# Patient Record
Sex: Female | Born: 1935 | Race: White | Hispanic: No | Smoking: Former smoker
Health system: Southern US, Community
[De-identification: ages and names within clinical notes are randomized; demographics above are authoritative.]

## PROBLEM LIST (undated history)

## (undated) DIAGNOSIS — Z8489 Family history of other specified conditions: Secondary | ICD-10-CM

## (undated) DIAGNOSIS — N289 Disorder of kidney and ureter, unspecified: Secondary | ICD-10-CM

## (undated) DIAGNOSIS — H353290 Exudative age-related macular degeneration, unspecified eye, stage unspecified: Secondary | ICD-10-CM

## (undated) DIAGNOSIS — E119 Type 2 diabetes mellitus without complications: Secondary | ICD-10-CM

## (undated) DIAGNOSIS — G459 Transient cerebral ischemic attack, unspecified: Secondary | ICD-10-CM

## (undated) DIAGNOSIS — G629 Polyneuropathy, unspecified: Secondary | ICD-10-CM

## (undated) DIAGNOSIS — H409 Unspecified glaucoma: Secondary | ICD-10-CM

## (undated) DIAGNOSIS — M810 Age-related osteoporosis without current pathological fracture: Secondary | ICD-10-CM

## (undated) DIAGNOSIS — E785 Hyperlipidemia, unspecified: Secondary | ICD-10-CM

## (undated) DIAGNOSIS — Z87891 Personal history of nicotine dependence: Secondary | ICD-10-CM

## (undated) DIAGNOSIS — H9190 Unspecified hearing loss, unspecified ear: Secondary | ICD-10-CM

## (undated) DIAGNOSIS — N63 Unspecified lump in unspecified breast: Secondary | ICD-10-CM

## (undated) DIAGNOSIS — Z8619 Personal history of other infectious and parasitic diseases: Secondary | ICD-10-CM

## (undated) DIAGNOSIS — I2510 Atherosclerotic heart disease of native coronary artery without angina pectoris: Secondary | ICD-10-CM

## (undated) DIAGNOSIS — I10 Essential (primary) hypertension: Secondary | ICD-10-CM

## (undated) DIAGNOSIS — M1990 Unspecified osteoarthritis, unspecified site: Secondary | ICD-10-CM

## (undated) HISTORY — PX: TONSILLECTOMY: SUR1361

## (undated) HISTORY — PX: BREAST BIOPSY: SHX20

## (undated) HISTORY — PX: APPENDECTOMY: SHX54

## (undated) HISTORY — PX: CARPAL TUNNEL RELEASE: SHX101

---

## 1960-12-16 HISTORY — PX: INNER EAR SURGERY: SHX679

## 1961-12-16 HISTORY — PX: TUBAL LIGATION: SHX77

## 1979-08-17 HISTORY — PX: MASTECTOMY: SHX3

## 1998-12-16 HISTORY — PX: CATARACT EXTRACTION W/ INTRAOCULAR LENS  IMPLANT, BILATERAL: SHX1307

## 2002-12-16 HISTORY — PX: CORONARY ANGIOPLASTY WITH STENT PLACEMENT: SHX49

## 2012-12-07 ENCOUNTER — Encounter (INDEPENDENT_AMBULATORY_CARE_PROVIDER_SITE_OTHER): Payer: Self-pay | Admitting: Ophthalmology

## 2013-05-10 ENCOUNTER — Encounter (HOSPITAL_COMMUNITY): Payer: Self-pay | Admitting: Emergency Medicine

## 2013-05-10 ENCOUNTER — Inpatient Hospital Stay (HOSPITAL_COMMUNITY)
Admission: EM | Admit: 2013-05-10 | Discharge: 2013-05-11 | DRG: 313 | Disposition: A | Discharge status: Nursing Home (Includes ICF) | Payer: Medicare PPO | Attending: Internal Medicine | Admitting: Internal Medicine

## 2013-05-10 ENCOUNTER — Emergency Department (HOSPITAL_COMMUNITY): Payer: Medicare PPO

## 2013-05-10 DIAGNOSIS — R0789 Other chest pain: Principal | ICD-10-CM | POA: Diagnosis present

## 2013-05-10 DIAGNOSIS — R079 Chest pain, unspecified: Secondary | ICD-10-CM | POA: Diagnosis present

## 2013-05-10 DIAGNOSIS — G609 Hereditary and idiopathic neuropathy, unspecified: Secondary | ICD-10-CM | POA: Diagnosis present

## 2013-05-10 DIAGNOSIS — J4489 Other specified chronic obstructive pulmonary disease: Secondary | ICD-10-CM | POA: Diagnosis present

## 2013-05-10 DIAGNOSIS — H9190 Unspecified hearing loss, unspecified ear: Secondary | ICD-10-CM | POA: Diagnosis present

## 2013-05-10 DIAGNOSIS — Z87891 Personal history of nicotine dependence: Secondary | ICD-10-CM

## 2013-05-10 DIAGNOSIS — Z823 Family history of stroke: Secondary | ICD-10-CM

## 2013-05-10 DIAGNOSIS — E119 Type 2 diabetes mellitus without complications: Secondary | ICD-10-CM | POA: Diagnosis present

## 2013-05-10 DIAGNOSIS — Z79899 Other long term (current) drug therapy: Secondary | ICD-10-CM

## 2013-05-10 DIAGNOSIS — H3530 Unspecified macular degeneration: Secondary | ICD-10-CM | POA: Diagnosis present

## 2013-05-10 DIAGNOSIS — I10 Essential (primary) hypertension: Secondary | ICD-10-CM | POA: Diagnosis present

## 2013-05-10 DIAGNOSIS — Z7982 Long term (current) use of aspirin: Secondary | ICD-10-CM

## 2013-05-10 DIAGNOSIS — I252 Old myocardial infarction: Secondary | ICD-10-CM

## 2013-05-10 DIAGNOSIS — E785 Hyperlipidemia, unspecified: Secondary | ICD-10-CM | POA: Diagnosis present

## 2013-05-10 DIAGNOSIS — I2510 Atherosclerotic heart disease of native coronary artery without angina pectoris: Secondary | ICD-10-CM | POA: Diagnosis present

## 2013-05-10 DIAGNOSIS — J449 Chronic obstructive pulmonary disease, unspecified: Secondary | ICD-10-CM | POA: Diagnosis present

## 2013-05-10 DIAGNOSIS — Z9861 Coronary angioplasty status: Secondary | ICD-10-CM

## 2013-05-10 HISTORY — DX: Personal history of other infectious and parasitic diseases: Z86.19

## 2013-05-10 HISTORY — DX: Unspecified hearing loss, unspecified ear: H91.90

## 2013-05-10 HISTORY — DX: Polyneuropathy, unspecified: G62.9

## 2013-05-10 HISTORY — DX: Personal history of nicotine dependence: Z87.891

## 2013-05-10 HISTORY — DX: Essential (primary) hypertension: I10

## 2013-05-10 HISTORY — DX: Hyperlipidemia, unspecified: E78.5

## 2013-05-10 HISTORY — DX: Atherosclerotic heart disease of native coronary artery without angina pectoris: I25.10

## 2013-05-10 LAB — BASIC METABOLIC PANEL
CO2: 30 mEq/L (ref 19–32)
Chloride: 102 mEq/L (ref 96–112)
Glucose, Bld: 135 mg/dL — ABNORMAL HIGH (ref 70–99)
Potassium: 4.3 mEq/L (ref 3.5–5.1)
Sodium: 141 mEq/L (ref 135–145)

## 2013-05-10 LAB — CBC WITH DIFFERENTIAL/PLATELET
Basophils Absolute: 0.1 10*3/uL (ref 0.0–0.1)
Lymphocytes Relative: 39 % (ref 12–46)
Lymphs Abs: 2.8 10*3/uL (ref 0.7–4.0)
Neutro Abs: 3 10*3/uL (ref 1.7–7.7)
Platelets: 263 10*3/uL (ref 150–400)
RBC: 4.01 MIL/uL (ref 3.87–5.11)
RDW: 12.6 % (ref 11.5–15.5)

## 2013-05-10 LAB — POCT I-STAT TROPONIN I: Troponin i, poc: 0 ng/mL (ref 0.00–0.08)

## 2013-05-10 NOTE — ED Provider Notes (Signed)
79 year old female history of coronary disease status post stenting in 2004 who presents with a complaint of recurrent chest pain. This occurred earlier in the day. She was given nitroglycerin and aspirin, the pain was not relieved by nitroglycerin initially. At this time she has only mild symptoms which are intermittent, no peripheral edema, soft abdomen, clear heart and lung sounds, no murmurs rubs or gallops. Her EKG is nonischemic, lab work is normal, she is high risk for recurrent coronary disease and will require admission to the hospital for a cardiac rule out.   Medical screening examination/treatment/procedure(s) were conducted as a shared visit with non-physician practitioner(s) and myself.  I personally evaluated the patient during the encounter    Vida Roller, MD 05/10/13 2333

## 2013-05-10 NOTE — ED Notes (Signed)
Per EMS came from assisted living at [facility] c/o Left sided CP that started one hour ago. Pain does not radiate. 7/10. No other symptoms except slight diaphoresis and HTN. Pt given 2 SL nitroglycerine and 325 ASA. Pain not relieved by nitro. Hx of previous MI and stent placement.

## 2013-05-10 NOTE — ED Provider Notes (Signed)
History     CSN: [id number]  Arrival date & time 05/10/13  2131   First MD Initiated Contact with Patient 05/10/13 2139      Chief Complaint  Patient presents with  . Chest Pain    (Consider location/radiation/quality/duration/timing/severity/associated sxs/prior treatment) HPI Comments: Patient is an 79 year old female with a past medical history of previous MI and stent placement, diabetes, and hypertension who presents with chest pain that started suddenly this evening. She arrived via EMS from [facility] living facility. Patient reports having sudden onset of squeezing chest pain that was located in the left chest and radiated across her chest. The patient denies associated nausea, dizziness, and diaphoresis. The pain lasted about about an hour and spontaneously resolved shortly after taking 325mg  ASA and SL nitro in the ambulance. No aggravating/allevaiting factors. Patient denies fever, headache, visual changes, vomiting, diarrhea, abdominal pain, numbness/tingling.    Patient is a 79 y.o. female presenting with chest pain.  Chest Pain   Past Medical History  Diagnosis Date  . Macular degeneration   . Diabetes mellitus without complication   . Hypertension   . Hyperlipemia     History reviewed. No pertinent past surgical history.  No family history on file.  History  Substance Use Topics  . Smoking status: Not on file  . Smokeless tobacco: Not on file  . Alcohol Use: Not on file    OB History   Grav Para Term Preterm Abortions TAB SAB Ect Mult Living                  Review of Systems  Cardiovascular: Positive for chest pain.  All other systems reviewed and are negative.    Allergies  Review of patient's allergies indicates no known allergies.  Home Medications  No current outpatient prescriptions on file.  BP 181/76  Pulse 69  Temp(Src) 98.3 F (36.8 C) (Oral)  Resp 14  SpO2 96%  Physical Exam  Nursing note and vitals  reviewed. Constitutional: She is oriented to person, place, and time. She appears well-developed and well-nourished. No distress.  HENT:  Head: Normocephalic and atraumatic.  Eyes: Conjunctivae and EOM are normal. Pupils are equal, round, and reactive to light.  Neck: Normal range of motion.  Cardiovascular: Normal rate and regular rhythm.  Exam reveals no gallop and no friction rub.   No murmur heard. Pulmonary/Chest: Effort normal and breath sounds normal. She has no wheezes. She has no rales. She exhibits no tenderness.  Abdominal: Soft. She exhibits no distension. There is no tenderness. There is no guarding.  Musculoskeletal: Normal range of motion.  Neurological: She is alert and oriented to person, place, and time. Coordination normal.  Speech is goal-oriented. Moves limbs without ataxia.   Skin: Skin is warm and dry.  Psychiatric: She has a normal mood and affect. Her behavior is normal.    ED Course  Procedures (including critical care time)   Date: 05/10/2013  Rate: 70  Rhythm: normal sinus rhythm  QRS Axis: normal  Intervals: normal  ST/T Wave abnormalities: normal  Conduction Disutrbances:none  Narrative Interpretation: NSR without previous for comparison  Old EKG Reviewed: none available    Labs Reviewed  CBC WITH DIFFERENTIAL - Abnormal; Notable for the following:    Neutrophils Relative % 42 (*)    Eosinophils Relative 7 (*)    Basophils Relative 2 (*)    All other components within normal limits  BASIC METABOLIC PANEL - Abnormal; Notable for the following:  Glucose, Bld 135 (*)    GFR calc non Af Amer 51 (*)    GFR calc Af Amer 59 (*)    All other components within normal limits  POCT I-STAT TROPONIN I   Dg Chest 2 View  05/10/2013   *RADIOLOGY REPORT*  Clinical Data: Chest pain, cough and mild shortness of breath.  CHEST - 2 VIEW  Comparison: None.  Findings: The lungs are well-aerated.  Minimal bibasilar opacities may reflect overlying soft tissues,  though mild pneumonia might have a similar appearance.  There is no evidence of pleural effusion or pneumothorax.  The heart is normal in size; the mediastinal contour is within normal limits.  No acute osseous abnormalities are seen.  IMPRESSION: Minimal bibasilar opacities may reflect overlying soft tissues, though mild pneumonia might have a similar appearance.   Original Report Authenticated By: Tonia Ghent, M.D.     1. Chest pain       MDM  9:58 PM Labs pending. EKG unremarkable without previous for comparison. Vitals stable and patient afebrile.   11:13 PM Labs unremarkable. Chest xray shows bibasilar opacities concerning for mild pneumonia. I will call hospitalist for admission for chest pain.   12:03 AM Patient admitted to hospitalist for chest pain.    Emilia Beck, PA-C 05/11/13 0003

## 2013-05-11 ENCOUNTER — Encounter (HOSPITAL_COMMUNITY): Payer: Self-pay | Admitting: *Deleted

## 2013-05-11 DIAGNOSIS — E119 Type 2 diabetes mellitus without complications: Secondary | ICD-10-CM | POA: Diagnosis present

## 2013-05-11 DIAGNOSIS — R079 Chest pain, unspecified: Secondary | ICD-10-CM | POA: Diagnosis present

## 2013-05-11 DIAGNOSIS — I200 Unstable angina: Secondary | ICD-10-CM

## 2013-05-11 DIAGNOSIS — I2510 Atherosclerotic heart disease of native coronary artery without angina pectoris: Secondary | ICD-10-CM | POA: Diagnosis present

## 2013-05-11 LAB — BASIC METABOLIC PANEL
BUN: 18 mg/dL (ref 6–23)
Creatinine, Ser: 0.93 mg/dL (ref 0.50–1.10)
GFR calc non Af Amer: 57 mL/min — ABNORMAL LOW (ref >90)
Glucose, Bld: 115 mg/dL — ABNORMAL HIGH (ref 70–99)
Potassium: 4.1 mEq/L (ref 3.5–5.1)

## 2013-05-11 LAB — CBC
HCT: 37 % (ref 36.0–46.0)
Hemoglobin: 12.6 g/dL (ref 12.0–15.0)
MCH: 31.3 pg (ref 26.0–34.0)
MCHC: 34.1 g/dL (ref 30.0–36.0)
MCV: 92 fL (ref 78.0–100.0)
RDW: 12.7 % (ref 11.5–15.5)

## 2013-05-11 LAB — GLUCOSE, CAPILLARY
Glucose-Capillary: 127 mg/dL — ABNORMAL HIGH (ref 70–99)
Glucose-Capillary: 113 mg/dL — ABNORMAL HIGH (ref 70–99)

## 2013-05-11 MED ORDER — SENNA 8.6 MG PO TABS
1.0000 | ORAL_TABLET | Freq: Every day | ORAL | Status: DC
Start: 2013-05-11 — End: 2013-05-11
  Administered 2013-05-11: 8.6 mg via ORAL
  Filled 2013-05-11: qty 1

## 2013-05-11 MED ORDER — ONDANSETRON HCL 4 MG/2ML IJ SOLN: 4.0000 mg | Freq: Three times a day (TID) | INTRAMUSCULAR | Status: AC | PRN

## 2013-05-11 MED ORDER — INSULIN ASPART 100 UNIT/ML ~~LOC~~ SOLN
0.0000 [IU] | SUBCUTANEOUS | Status: DC
  Administered 2013-05-11 (×2): 2 [IU] via SUBCUTANEOUS

## 2013-05-11 MED ORDER — SIMVASTATIN 10 MG PO TABS
10.0000 mg | ORAL_TABLET | Freq: Every day | ORAL | Status: DC
  Filled 2013-05-11: qty 1

## 2013-05-11 MED ORDER — CLONIDINE HCL 0.1 MG PO TABS
0.1000 mg | ORAL_TABLET | Freq: Every day | ORAL | Status: DC
  Filled 2013-05-11: qty 1

## 2013-05-11 MED ORDER — GUAIFENESIN ER 600 MG PO TB12
600.0000 mg | ORAL_TABLET | Freq: Two times a day (BID) | ORAL | Status: DC | PRN
Start: 2013-05-11 — End: 2013-05-11
  Filled 2013-05-11: qty 1

## 2013-05-11 MED ORDER — CLONIDINE HCL 0.1 MG PO TABS
0.1000 mg | ORAL_TABLET | Freq: Two times a day (BID) | ORAL | Status: DC
  Filled 2013-05-11: qty 1

## 2013-05-11 MED ORDER — ERYTHROMYCIN 5 MG/GM OP OINT: 1.0000 "application " | TOPICAL_OINTMENT | OPHTHALMIC | Status: DC | PRN

## 2013-05-11 MED ORDER — DOCUSATE SODIUM 100 MG PO CAPS
100.0000 mg | ORAL_CAPSULE | Freq: Two times a day (BID) | ORAL | Status: DC
  Administered 2013-05-11: 100 mg via ORAL
  Filled 2013-05-11: qty 1

## 2013-05-11 MED ORDER — POLYETHYLENE GLYCOL 3350 17 G PO PACK
17.0000 g | PACK | Freq: Every day | ORAL | Status: DC | PRN
  Filled 2013-05-11: qty 1

## 2013-05-11 MED ORDER — NEBIVOLOL HCL 5 MG PO TABS
5.0000 mg | ORAL_TABLET | Freq: Every day | ORAL | Status: DC
  Administered 2013-05-11: 5 mg via ORAL
  Filled 2013-05-11: qty 1

## 2013-05-11 MED ORDER — BIMATOPROST 0.01 % OP SOLN: 1.0000 [drp] | Freq: Every day | OPHTHALMIC | Status: DC

## 2013-05-11 MED ORDER — ISOSORBIDE MONONITRATE ER 30 MG PO TB24: 30.0000 mg | ORAL_TABLET | Freq: Every day | ORAL | Status: DC

## 2013-05-11 MED ORDER — CLONIDINE HCL 0.1 MG PO TABS: 0.1000 mg | ORAL_TABLET | Freq: Two times a day (BID) | ORAL | Status: DC

## 2013-05-11 MED ORDER — HYDRALAZINE HCL 20 MG/ML IJ SOLN
20.0000 mg | INTRAMUSCULAR | Status: AC
  Administered 2013-05-11: 20 mg via INTRAVENOUS
  Filled 2013-05-11: qty 1

## 2013-05-11 MED ORDER — MORPHINE SULFATE 2 MG/ML IJ SOLN
1.0000 mg | INTRAMUSCULAR | Status: AC
  Administered 2013-05-11: 1 mg via INTRAVENOUS

## 2013-05-11 MED ORDER — ASPIRIN EC 325 MG PO TBEC
325.0000 mg | DELAYED_RELEASE_TABLET | Freq: Every day | ORAL | Status: DC
  Administered 2013-05-11: 325 mg via ORAL
  Filled 2013-05-11: qty 1

## 2013-05-11 MED ORDER — MELOXICAM 15 MG PO TABS
15.0000 mg | ORAL_TABLET | Freq: Every day | ORAL | Status: DC
Start: 2013-05-11 — End: 2013-05-11
  Administered 2013-05-11: 15 mg via ORAL
  Filled 2013-05-11: qty 1

## 2013-05-11 MED ORDER — CALCIUM CARBONATE-VITAMIN D 500-200 MG-UNIT PO TABS
1.0000 | ORAL_TABLET | Freq: Every day | ORAL | Status: DC
  Administered 2013-05-11: 1 via ORAL
  Filled 2013-05-11: qty 1

## 2013-05-11 MED ORDER — GABAPENTIN 300 MG PO CAPS
300.0000 mg | ORAL_CAPSULE | Freq: Every day | ORAL | Status: DC
  Filled 2013-05-11: qty 1

## 2013-05-11 MED ORDER — LORATADINE 10 MG PO TABS
10.0000 mg | ORAL_TABLET | Freq: Every day | ORAL | Status: DC
  Administered 2013-05-11: 10 mg via ORAL
  Filled 2013-05-11: qty 1

## 2013-05-11 MED ORDER — MORPHINE SULFATE 2 MG/ML IJ SOLN: 2.0000 mg | Freq: Once | INTRAMUSCULAR | Status: DC

## 2013-05-11 MED ORDER — NITROGLYCERIN 0.4 MG SL SUBL: 0.4000 mg | SUBLINGUAL_TABLET | SUBLINGUAL | Status: DC | PRN

## 2013-05-11 MED ORDER — ISOSORBIDE MONONITRATE ER 30 MG PO TB24
30.0000 mg | ORAL_TABLET | Freq: Every day | ORAL | Status: DC
  Administered 2013-05-11: 30 mg via ORAL
  Filled 2013-05-11: qty 1

## 2013-05-11 MED ORDER — HEPARIN SODIUM (PORCINE) 5000 UNIT/ML IJ SOLN
5000.0000 [IU] | Freq: Three times a day (TID) | INTRAMUSCULAR | Status: DC
  Administered 2013-05-11: 5000 [IU] via SUBCUTANEOUS
  Filled 2013-05-11 (×4): qty 1

## 2013-05-11 MED ORDER — MORPHINE SULFATE 2 MG/ML IJ SOLN
2.0000 mg | INTRAMUSCULAR | Status: DC
  Filled 2013-05-11: qty 1

## 2013-05-11 MED ORDER — SODIUM CHLORIDE 0.9 % IJ SOLN
3.0000 mL | Freq: Two times a day (BID) | INTRAMUSCULAR | Status: DC
  Administered 2013-05-11: 3 mL via INTRAVENOUS

## 2013-05-11 MED ORDER — CLONIDINE HCL 0.1 MG PO TABS
0.1000 mg | ORAL_TABLET | Freq: Two times a day (BID) | ORAL | Status: DC
  Administered 2013-05-11: 0.1 mg via ORAL
  Filled 2013-05-11 (×2): qty 1

## 2013-05-11 MED ORDER — ISOSORBIDE MONONITRATE ER 30 MG PO TB24: 30.0000 mg | ORAL_TABLET | Freq: Every day | ORAL | Status: AC

## 2013-05-11 MED ORDER — POLYVINYL ALCOHOL 1.4 % OP SOLN
1.0000 [drp] | Freq: Every day | OPHTHALMIC | Status: DC
  Administered 2013-05-11 (×2): 1 [drp] via OPHTHALMIC
  Filled 2013-05-11: qty 15

## 2013-05-11 MED ORDER — BIMATOPROST 0.01 % OP SOLN
2.0000 [drp] | Freq: Every day | OPHTHALMIC | Status: DC
  Administered 2013-05-11: 1 [drp] via OPHTHALMIC
  Filled 2013-05-11: qty 2.5

## 2013-05-11 NOTE — Discharge Summary (Addendum)
Physician Discharge Summary  Judy Perez WUJ:[id number] DOB: [birth year] DOA: 05/10/2013  PCP: Pcp Not In System  Admit date: 05/10/2013 Discharge date: 05/11/2013  Time spent: 50 minutes  Recommendations for Outpatient Follow-up:  1. [facility], Tereso Newcomer 6/12 at 11am  Discharge Diagnoses:  Principal Problem:   Chest pain Active Problems:   DM2 (diabetes mellitus, type 2)   CAD (coronary artery disease)   Discharge Condition: stable  Diet recommendation: Low sodium, diabetic  Filed Weights   05/11/13 0146  Weight: 77.9 kg (171 lb 11.8 oz)    History of present illness:  Judy Perez is a 79 y.o. female with pmh of DM2 and CAD s/p stenting in 2006. Who presents with new onset chest pain. Pain was located in her Left chest, resolved completely after receiving MONA on way to ER. Crushing in quality, no associated nausea, fever, chills, headache, abdominal pain.  In ED EKG unremarkable, first troponin is negative, hospitalist has been asked to admit.   Hospital Course:  1. Chest pain /CAD: Pt presented with chest pain in the setting of marked hypertension last night. Ss were initially responsive to nitrates but were intermittent throughout the night requiring prn morphine. She doesn't quite remember her anginal Ss 10 yrs ago. She is currently pain free and troponins are negative x2. We discussed stress testing however she is no interested in any evaluation at this time. She is agreeable to following up with [facility]. Started on long-acting nitrate to her regimen and change her clonidine to bid for better BP control.  Outpatient FU arranged.   2. HTN: Poorly controlled. Changing clonidine to bid. And started on Imdur   3. HL: On statin.   4. DM: On metformin @ home/SSI while here.     Discharge Exam: Filed Vitals:   05/11/13 0146 05/11/13 0449 05/11/13 1100 05/11/13 1141  BP: 166/78 155/57 155/78 153/48  Pulse: 76 81    Temp: 98.4 F (36.9 C)  98.4 F (36.9 C)    TempSrc:  Oral    Resp: 18 18    Height: 5\' 1"  (1.549 m)     Weight: 77.9 kg (171 lb 11.8 oz)     SpO2: 95% 97%      General: AAOx3 Cardiovascular: S1S2/RRR Respiratory: CTAB  Discharge Instructions      Discharge Orders   Future Appointments Provider Department Dept Phone   05/27/2013 11:10 AM Beatrice Lecher, PA-C  [facility] [place]) [phone]   Future Orders Complete By Expires     Diet - low sodium heart healthy  As directed     Diet Carb Modified  As directed     Increase activity slowly  As directed         Medication List    TAKE these medications       acetaminophen 325 MG tablet  Commonly known as:  TYLENOL  Take 650 mg by mouth every 6 (six) hours as needed for pain.     alendronate 35 MG tablet  Commonly known as:  FOSAMAX  Take 35 mg by mouth every 7 (seven) days. fridays     aspirin 325 MG buffered tablet  Take 325 mg by mouth daily.     bimatoprost 0.01 % Soln  Commonly known as:  LUMIGAN  Place 1 drop into both eyes daily.     calcium-vitamin D 500-200 MG-UNIT per tablet  Commonly known as:  OSCAL WITH D  Take 1 tablet by mouth daily.  cloNIDine 0.1 MG tablet  Commonly known as:  CATAPRES  Take 1 tablet (0.1 mg total) by mouth 2 (two) times daily.     docusate sodium 100 MG capsule  Commonly known as:  COLACE  Take 100 mg by mouth 2 (two) times daily.     erythromycin ophthalmic ointment  Place 1 application into the right eye every 2 (two) hours as needed (for pain).     gabapentin 300 MG capsule  Commonly known as:  NEURONTIN  Take 300 mg by mouth at bedtime.     guaiFENesin 600 MG 12 hr tablet  Commonly known as:  MUCINEX  Take 600 mg by mouth 2 (two) times daily as needed for congestion.     I-VITE PO  Take 1 tablet by mouth daily.     isosorbide mononitrate 30 MG 24 hr tablet  Commonly known as:  IMDUR  Take 1 tablet (30 mg total) by mouth daily.     loratadine 10 MG tablet   Commonly known as:  CLARITIN  Take 10 mg by mouth daily.     meloxicam 15 MG tablet  Commonly known as:  MOBIC  Take 15 mg by mouth daily.     metFORMIN 1000 MG tablet  Commonly known as:  GLUCOPHAGE  Take 1,000 mg by mouth daily with breakfast.     nebivolol 5 MG tablet  Commonly known as:  BYSTOLIC  Take 5 mg by mouth daily.     polyethylene glycol packet  Commonly known as:  MIRALAX / GLYCOLAX  Take 17 g by mouth daily as needed (constipation).     polyvinyl alcohol 1.4 % ophthalmic solution  Commonly known as:  LIQUIFILM TEARS  Place 1 drop into both eyes 6 (six) times daily.     senna 8.6 MG Tabs  Commonly known as:  SENOKOT  Take 1 tablet by mouth daily.     simvastatin 10 MG tablet  Commonly known as:  ZOCOR  Take 10 mg by mouth at bedtime.     TAB-A-VITE PO  Take 1 tablet by mouth daily.       No Known Allergies Follow-up Information   Follow up with Tereso Newcomer, PA-C On 05/27/2013. (11:10 AM)    Contact information:   [address] [place] [address] [phone]       Follow up with Pcp Not In System In 1 week.       The results of significant diagnostics from this hospitalization (including imaging, microbiology, ancillary and laboratory) are listed below for reference.    Significant Diagnostic Studies: Dg Chest 2 View  05/10/2013   *RADIOLOGY REPORT*  Clinical Data: Chest pain, cough and mild shortness of breath.  CHEST - 2 VIEW  Comparison: None.  Findings: The lungs are well-aerated.  Minimal bibasilar opacities may reflect overlying soft tissues, though mild pneumonia might have a similar appearance.  There is no evidence of pleural effusion or pneumothorax.  The heart is normal in size; the mediastinal contour is within normal limits.  No acute osseous abnormalities are seen.  IMPRESSION: Minimal bibasilar opacities may reflect overlying soft tissues, though mild pneumonia might have a similar appearance.   Original Report  Authenticated By: Tonia Ghent, M.D.    Microbiology: No results found for this or any previous visit (from the past 240 hour(s)).   Labs: Basic Metabolic Panel:  Recent Labs Lab 05/10/13 2152 05/11/13 0545  NA 141 139  K 4.3 4.1  CL 102  103  CO2 30 24  GLUCOSE 135* 115*  BUN 21 18  CREATININE 1.01 0.93  CALCIUM 9.6 9.5   Liver Function Tests: No results found for this basename: AST, ALT, ALKPHOS, BILITOT, PROT, ALBUMIN,  in the last 168 hours No results found for this basename: LIPASE, AMYLASE,  in the last 168 hours No results found for this basename: AMMONIA,  in the last 168 hours CBC:  Recent Labs Lab 05/10/13 2152 05/11/13 0545  WBC 7.2 7.0  NEUTROABS 3.0  --   HGB 12.5 12.6  HCT 37.0 37.0  MCV 92.3 92.0  PLT 263 260   Cardiac Enzymes:  Recent Labs Lab 05/11/13 0545 05/11/13 0926  TROPONINI <0.30 <0.30   BNP: BNP (last 3 results) No results found for this basename: PROBNP,  in the last 8760 hours CBG:  Recent Labs Lab 05/11/13 0450 05/11/13 0801 05/11/13 1151  GLUCAP 127* 113* 133*       Signed:  Mervyn Pflaum  [facility] 05/11/2013, 1:05 PM

## 2013-05-11 NOTE — H&P (Signed)
[facility] History and Physical  Morgin Halls NFA:[id number] DOB: [birth year] DOA: 05/10/2013  Referring physician: ED PCP: Pcp Not In System   Chief Complaint: Chest pain  HPI: Judy Perez is a 79 y.o. female with pmh of DM2 and CAD s/p stenting in 2006.  Who presents with new onset chest pain.  Pain was located in her Left chest, resolved completely after receiving MONA on way to ER.  Crushing in quality, no associated nausea, fever, chills, headache, abdominal pain.  In ED EKG unremarkable, first troponin is negative, hospitalist has been asked to admit.  Review of Systems: 12 systems reviewed but otherwise negative.  Past Medical History  Diagnosis Date  . Macular degeneration   . Diabetes mellitus without complication   . Hypertension   . Hyperlipemia   . Acute MI 2004  . COPD (chronic obstructive pulmonary disease)    History reviewed. No pertinent past surgical history. Social History:  reports that she has quit smoking. Her smoking use included Cigarettes. She smoked 0.00 packs per day. She does not have any smokeless tobacco history on file. Her alcohol and drug histories are not on file.   No Known Allergies  History reviewed. No pertinent family history.  Prior to Admission medications   Medication Sig Start Date End Date Taking? Authorizing Provider  acetaminophen (TYLENOL) 325 MG tablet Take 650 mg by mouth every 6 (six) hours as needed for pain.   Yes Historical Provider, MD  alendronate (FOSAMAX) 35 MG tablet Take 35 mg by mouth every 7 (seven) days. fridays   Yes Historical Provider, MD  aspirin 325 MG buffered tablet Take 325 mg by mouth daily.   Yes Historical Provider, MD  bimatoprost (LUMIGAN) 0.01 % SOLN Place 2 drops into both eyes daily.   Yes Historical Provider, MD  calcium-vitamin D (OSCAL WITH D) 500-200 MG-UNIT per tablet Take 1 tablet by mouth daily.   Yes Historical Provider, MD  cloNIDine (CATAPRES) 0.1 MG tablet Take 0.1 mg by mouth  daily.   Yes Historical Provider, MD  docusate sodium (COLACE) 100 MG capsule Take 100 mg by mouth 2 (two) times daily.   Yes Historical Provider, MD  erythromycin ophthalmic ointment Place 1 application into the right eye every 2 (two) hours as needed (for pain).   Yes Historical Provider, MD  gabapentin (NEURONTIN) 300 MG capsule Take 300 mg by mouth at bedtime.   Yes Historical Provider, MD  guaiFENesin (MUCINEX) 600 MG 12 hr tablet Take 600 mg by mouth 2 (two) times daily as needed for congestion.   Yes Historical Provider, MD  loratadine (CLARITIN) 10 MG tablet Take 10 mg by mouth daily.   Yes Historical Provider, MD  meloxicam (MOBIC) 15 MG tablet Take 15 mg by mouth daily.   Yes Historical Provider, MD  metFORMIN (GLUCOPHAGE) 1000 MG tablet Take 1,000 mg by mouth daily with breakfast.   Yes Historical Provider, MD  Multiple Vitamin (TAB-A-VITE PO) Take 1 tablet by mouth daily.   Yes Historical Provider, MD  Multiple Vitamins-Minerals (I-VITE PO) Take 1 tablet by mouth daily.   Yes Historical Provider, MD  nebivolol (BYSTOLIC) 5 MG tablet Take 5 mg by mouth daily.   Yes Historical Provider, MD  polyethylene glycol (MIRALAX / GLYCOLAX) packet Take 17 g by mouth daily as needed (constipation).   Yes Historical Provider, MD  polyvinyl alcohol (LIQUIFILM TEARS) 1.4 % ophthalmic solution Place 1 drop into both eyes 6 (six) times daily.   Yes Historical Provider, MD  senna (  SENOKOT) 8.6 MG TABS Take 1 tablet by mouth daily.   Yes Historical Provider, MD  simvastatin (ZOCOR) 10 MG tablet Take 10 mg by mouth at bedtime.   Yes Historical Provider, MD   Physical Exam: Filed Vitals:   05/11/13 0100 05/11/13 0112 05/11/13 0140 05/11/13 0146  BP: 196/130 174/67 153/67 166/78  Pulse: 85 81 80 76  Temp:    98.4 F (36.9 C)  TempSrc:      Resp: 22 16 18 18   Height:    5\' 1"  (1.549 m)  Weight:    77.9 kg (171 lb 11.8 oz)  SpO2: 94% 96% 94% 95%    General:  NAD, resting comfortably in bed Eyes:  PEERLA EOMI ENT: mucous membranes moist, patient very hard of hearing Neck: supple w/o JVD Cardiovascular: RRR w/o MRG Respiratory: CTA B Abdomen: soft, nt, nd, bs+ Skin: no rash nor lesion Musculoskeletal: MAE, full ROM all 4 extremities Psychiatric: normal tone and affect Neurologic: AAOx3, grossly non-focal  Labs on Admission:  Basic Metabolic Panel:  Recent Labs Lab 05/10/13 2152  NA 141  K 4.3  CL 102  CO2 30  GLUCOSE 135*  BUN 21  CREATININE 1.01  CALCIUM 9.6   Liver Function Tests: No results found for this basename: AST, ALT, ALKPHOS, BILITOT, PROT, ALBUMIN,  in the last 168 hours No results found for this basename: LIPASE, AMYLASE,  in the last 168 hours No results found for this basename: AMMONIA,  in the last 168 hours CBC:  Recent Labs Lab 05/10/13 2152  WBC 7.2  NEUTROABS 3.0  HGB 12.5  HCT 37.0  MCV 92.3  PLT 263   Cardiac Enzymes: No results found for this basename: CKTOTAL, CKMB, CKMBINDEX, TROPONINI,  in the last 168 hours  BNP (last 3 results) No results found for this basename: PROBNP,  in the last 8760 hours CBG: No results found for this basename: GLUCAP,  in the last 168 hours  Radiological Exams on Admission: Dg Chest 2 View  05/10/2013   *RADIOLOGY REPORT*  Clinical Data: Chest pain, cough and mild shortness of breath.  CHEST - 2 VIEW  Comparison: None.  Findings: The lungs are well-aerated.  Minimal bibasilar opacities may reflect overlying soft tissues, though mild pneumonia might have a similar appearance.  There is no evidence of pleural effusion or pneumothorax.  The heart is normal in size; the mediastinal contour is within normal limits.  No acute osseous abnormalities are seen.  IMPRESSION: Minimal bibasilar opacities may reflect overlying soft tissues, though mild pneumonia might have a similar appearance.   Original Report Authenticated By: Tonia Ghent, M.D.    EKG: Independently reviewed.  Assessment/Plan Principal  Problem:   Chest pain Active Problems:   DM2 (diabetes mellitus, type 2)   CAD (coronary artery disease)   1. Chest pain - admitting as rule out cardiac, given prior history likely will end up needing stress test vs cath.  Likely needs cards consult and to set up with cardiologist given known h/o CAD in past.  Patient NPO, trending troponins, tele monitor, no chest pain at this time actively. 2. DM2 - putting patient on SSI and holding home metformin 3. CAD - last cath and stenting was in 2006, last stress test was "a couple of days before the cath"    Code Status: Full Code (must indicate code status--if unknown or must be presumed, indicate so) Family Communication: No family at bedside (indicate person spoken with, if applicable, with phone number if  by telephone) Disposition Plan: Admit to inpatient (indicate anticipated LOS)  Time spent: 70 min  Zoey Gilkeson M. [facility] Pager [phone]  If 7PM-7AM, please contact night-coverage [email or web address] Password [facility] 05/11/2013, 4:42 AM

## 2013-05-11 NOTE — Clinical Social Work Psychosocial (Signed)
     Clinical Social Work Department BRIEF PSYCHOSOCIAL ASSESSMENT 05/11/2013  Patient:  Judy Perez, Judy Perez     Account Number:  [id number]     Admit date:  05/10/2013  Clinical Social Worker:  Hulan Fray  Date/Time:  05/11/2013 01:51 PM  Referred by:  Care Management  Date Referred:  05/11/2013 Referred for  Other - See comment   Other Referral:   Admit from facility   Interview type:  Family Other interview type:   son- Judy Perez    PSYCHOSOCIAL DATA Living Status:  FACILITY Admitted from facility:  [facility] Level of care:  Assisted Living Primary support name:  Judy Perez Primary support relationship to patient:  CHILD, ADULT Degree of support available:   support    CURRENT CONCERNS Current Concerns  None Noted   Other Concerns:    SOCIAL WORK ASSESSMENT / PLAN Clinical Social Worker was informed that patient is from [facility] and is ready for discharge today. CSW contacted facility and sent discharge information. CSW spoke with son, who reported that patient is to go by non-emergent ambulance. CSW will arrange form and leave in [place] on shadow chart.   Assessment/plan status:  Psychosocial Support/Ongoing Assessment of Needs Other assessment/ plan:   Information/referral to community resources:   Patient is from ALF    PATIENTS/FAMILYS RESPONSE TO PLAN OF CARE: Son reported plan is for patient to return back to ALF today.

## 2013-05-11 NOTE — ED Provider Notes (Signed)
Medical screening examination/treatment/procedure(s) were conducted as a shared visit with non-physician practitioner(s) and myself.  I personally evaluated the patient during the encounter  Please see my separate respective documentation pertaining to this patient encounter   Vida Roller, MD 05/11/13 [phone]

## 2013-05-11 NOTE — Care Management Note (Signed)
    Page 1 of 1   05/11/2013     3:47:40 PM   CARE MANAGEMENT NOTE 05/11/2013  Patient:  Judy Perez, Judy Perez   Account Number:  [id number]  Date Initiated:  05/11/2013  Documentation initiated by:  Heber Hoog  Subjective/Objective Assessment:   PT ADM ON 05/10/13 WITH CHEST PAIN.  PTA, PT RESIDES AT [facility].     Action/Plan:   CSW CONSULTED TO FACILITATE DC TO ALF WHEN MEDICALLY STABLE, [place] TODAY.   Anticipated DC Date:  05/11/2013   Anticipated DC Plan:  ASSISTED LIVING / REST HOME  In-house referral  Clinical Social Worker      DC Planning Services  CM consult      Choice offered to / List presented to:             Status of service:  Completed, signed off Medicare Important Message given?   (If response is "NO", the following Medicare IM given date fields will be blank) Date Medicare IM given:   Date Additional Medicare IM given:    Discharge Disposition:  ASSISTED LIVING  Per UR Regulation:  Reviewed for med. necessity/level of care/duration of stay  If discussed at Long Length of Stay Meetings, dates discussed:    Comments:

## 2013-05-11 NOTE — Progress Notes (Signed)
Utilization Review Completed.Jasmin Trumbull T5/27/2014  

## 2013-05-11 NOTE — Consult Note (Signed)
CARDIOLOGY CONSULT NOTE  Patient ID: Judy Perez MRN: [id number], DOB/AGE: Dec 30, 1929   Admit date: 05/10/2013 Date of Consult: 05/11/2013  Primary Physician: Dr. Redmond School - [facility] Primary Cardiologist: New  Pt. Profile  79 y/o female with a h/o CAD s/p stenting in 2004, who was admitted overnight 2/2 recurrent chest pain.  Problem List  Past Medical History  Diagnosis Date  . CAD (coronary artery disease)     a. 2004 Cath/PCI with stenting x 1 in [place].  . Diabetes mellitus without complication   . Hypertension   . Hyperlipemia   . Macular degeneration   . COPD (chronic obstructive pulmonary disease)   . History of tobacco abuse     a. 50 pack yr hx, quit 2004  . History of shingles     a. left leg with residual pain.  . Peripheral neuropathy   . Hard of hearing     History reviewed. No pertinent past surgical history.   Allergies  No Known Allergies  HPI   79 y/o female with a h/o CAD s/p stenting of an unknown vessel in [place], in 2004.  She has not had cardiology f/u since then and has not required any additional cardiac evaluation either (stress, cath, etc).  She now lives @ [employer] [facility], where she is relatively sedentary, with activity limited by chronic L>R leg pain/neuropathy and macular degeneration.  She was in her USOH until last night at approximately 8:30 PM, when she was lying in bed and had sudden onset of substernal chest discomfort without associated symptoms.  She alerted the med tech and EMS was called.  Following their arrival, she was treated with aspirin and nitroglycerin with relief of the discomfort.  Her BP was markedly elevated. She was taken to the [facility] ED where initial troponin was negative and ECG was felt to be non-acute.  She did experience intermittent, recurrent chest pain overnight and was treated with prn morphine.  Her BP was also elevated in the ED (203/72) and was treated with prn hydralazine.  She is currently pain free.  Her f/u  troponin from earlier this AM is nl and a third was just sent off to the lab.  She does not wish to pursue stress testing or cath and has requested discharge with outpt f/u.  Inpatient Medications  . aspirin EC  325 mg Oral Daily  . bimatoprost  2 drop Both Eyes Daily  . calcium-vitamin D  1 tablet Oral Daily  . cloNIDine  0.1 mg Oral Daily  . docusate sodium  100 mg Oral BID  . gabapentin  300 mg Oral QHS  . heparin  5,000 Units Subcutaneous Q8H  . insulin aspart  0-15 Units Subcutaneous Q4H  . loratadine  10 mg Oral Daily  . meloxicam  15 mg Oral Daily  .  morphine injection  2 mg Intravenous Once  . nebivolol  5 mg Oral Daily  . polyvinyl alcohol  1 drop Both Eyes 6 X Daily  . senna  1 tablet Oral Daily  . simvastatin  10 mg Oral QHS  . sodium chloride  3 mL Intravenous Q12H   Family History Family History  Problem Relation Age of Onset  . Stroke Father     deceased  . Other Mother     deceased - possibly renal failure    Social History History   Social History  . Marital Status: Widowed    Spouse Name: N/A    Number of Children: N/A  .  Years of Education: N/A   Occupational History  . Not on file.   Social History Main Topics  . Smoking status: Former Smoker -- 1.00 packs/day for 50 years    Types: Cigarettes    Quit date: 05/12/2003  . Smokeless tobacco: Not on file  . Alcohol Use: Yes     Comment: 1 drink every few weeks.  . Drug Use: No  . Sexually Active: Not Currently   Other Topics Concern  . Not on file   Social History Narrative   Lives in [facility].  Does not routinely exercise.  Ambulates with a cane.    Review of Systems  General:  No chills, fever, night sweats or weight changes.  Cardiovascular:  +++ chest pain, no dyspnea on exertion, edema, orthopnea, palpitations, paroxysmal nocturnal dyspnea. Dermatological: No rash, lesions/masses Respiratory: No cough, dyspnea Urologic: No hematuria, dysuria Abdominal:   No  nausea, vomiting, diarrhea, bright red blood per rectum, melena, or hematemesis Neurologic:  No visual changes, wkns, changes in mental status. MSK:  Chronic bilat L > R leg pain. All other systems reviewed and are otherwise negative except as noted above.  Physical Exam  Blood pressure 155/57, pulse 81, temperature 98.4 F (36.9 C), temperature source Oral, resp. rate 18, height 5\' 1"  (1.549 m), weight 171 lb 11.8 oz (77.9 kg), SpO2 97.00%.  General: Pleasant, NAD Psych: Normal affect. Neuro: Alert and oriented X 3. Moves all extremities spontaneously. HEENT: Very HOH.  R subconjunctival hemorrhage.  Neck: Supple without bruits or JVD. Lungs:  Resp regular and unlabored, diminished @ bases. Heart: RRR no s3, s4, or murmurs. Abdomen: Soft, non-tender, non-distended, BS + x 4.  Extremities: No clubbing, cyanosis or edema. DP/PT/Radials 2+ and equal bilaterally.  Labs   Recent Labs  05/11/13 0545  TROPONINI <0.30   Lab Results  Component Value Date   WBC 7.0 05/11/2013   HGB 12.6 05/11/2013   HCT 37.0 05/11/2013   MCV 92.0 05/11/2013   PLT 260 05/11/2013    Recent Labs Lab 05/11/13 0545  NA 139  K 4.1  CL 103  CO2 24  BUN 18  CREATININE 0.93  CALCIUM 9.5  GLUCOSE 115*   Radiology/Studies  Dg Chest 2 View  05/10/2013   *RADIOLOGY REPORT*  Clinical Data: Chest pain, cough and mild shortness of breath.  CHEST - 2 VIEW   IMPRESSION: Minimal bibasilar opacities may reflect overlying soft tissues, though mild pneumonia might have a similar appearance.   Original Report Authenticated By: Tonia Ghent, M.D.   ECG  Rsr, 70, slight st elevation V1-V3, twi aVL.  ASSESSMENT AND PLAN  1.  USA/CAD:  Pt presented with chest pain in the setting of marked hypertension last night.  Ss were initially responsive to nitrates but were intermittent throughout the night requiring prn morphine.  She doesn't quite remember her anginal Ss 10 yrs ago.  She is currently pain free and troponins  are negative up to this point.  F/u troponin that was just drawn.  We discussed stress testing however she is no interested in any evaluation at this time.  She is agreeable to following up in our office as an outpt.  This being the case, will add long-acting nitrate to her regimen and change her clonidine to bid for better BP control.  Add prn sl NTG @ d/c.  We will arrange oupt f/u.  2.  HTN:  Poorly controlled.  Changing clonidine to bid.  Will need outpt  f/u.  With DM, ideally should be on ACEI.  3.  HL:  On statin.  4. DM:  On metformin @ home/SSI while here.  Gluc reasonable.  Signed, Nicolasa Ducking, NP 05/11/2013, 9:45 AM I have taken a history, reviewed medications, allergies, PMH, SH, FH, and reviewed ROS and examined the patient.  I agree with the assessment and plan. Nice discussion with patient. Med compliance for BP control and the use of SL NTG for angina made clear. Can be discharged today.  Jaelin Fackler C. Daleen Squibb, MD, [facility] Pager:  [phone]

## 2013-05-11 NOTE — Progress Notes (Signed)
05/11/2013 3:08 PM Nursing note Discharge avs form reviewed with patient along with new medications ordered. D/c iv line and tele by NT. Follow up appointments reviewed. Report called to [facility]. Questions and concerns addressed.  Pt. Son, Ree Kida called for patient update. Informed of patient discharge and concerns addressed. Rx for new medications sent in packet for [facility]. D/c by EMS transport to [facility] per orders.  Srinivas Lippman, Blanchard Kelch

## 2013-05-12 ENCOUNTER — Encounter (HOSPITAL_COMMUNITY): Payer: Self-pay

## 2013-05-12 ENCOUNTER — Observation Stay (HOSPITAL_COMMUNITY)
Admission: EM | Admit: 2013-05-12 | Discharge: 2013-05-14 | Disposition: A | Discharge status: Skilled Nursing Facility | Payer: Medicare PPO | Attending: Family Medicine | Admitting: Family Medicine

## 2013-05-12 ENCOUNTER — Emergency Department (HOSPITAL_COMMUNITY): Payer: Medicare PPO

## 2013-05-12 DIAGNOSIS — E119 Type 2 diabetes mellitus without complications: Secondary | ICD-10-CM | POA: Diagnosis present

## 2013-05-12 DIAGNOSIS — Z79899 Other long term (current) drug therapy: Secondary | ICD-10-CM | POA: Insufficient documentation

## 2013-05-12 DIAGNOSIS — H353290 Exudative age-related macular degeneration, unspecified eye, stage unspecified: Secondary | ICD-10-CM | POA: Insufficient documentation

## 2013-05-12 DIAGNOSIS — J449 Chronic obstructive pulmonary disease, unspecified: Secondary | ICD-10-CM | POA: Insufficient documentation

## 2013-05-12 DIAGNOSIS — R471 Dysarthria and anarthria: Secondary | ICD-10-CM | POA: Insufficient documentation

## 2013-05-12 DIAGNOSIS — J4489 Other specified chronic obstructive pulmonary disease: Secondary | ICD-10-CM | POA: Insufficient documentation

## 2013-05-12 DIAGNOSIS — H9190 Unspecified hearing loss, unspecified ear: Secondary | ICD-10-CM | POA: Insufficient documentation

## 2013-05-12 DIAGNOSIS — E785 Hyperlipidemia, unspecified: Secondary | ICD-10-CM | POA: Insufficient documentation

## 2013-05-12 DIAGNOSIS — I6529 Occlusion and stenosis of unspecified carotid artery: Secondary | ICD-10-CM | POA: Insufficient documentation

## 2013-05-12 DIAGNOSIS — R079 Chest pain, unspecified: Principal | ICD-10-CM | POA: Diagnosis present

## 2013-05-12 DIAGNOSIS — F411 Generalized anxiety disorder: Secondary | ICD-10-CM | POA: Insufficient documentation

## 2013-05-12 DIAGNOSIS — F29 Unspecified psychosis not due to a substance or known physiological condition: Secondary | ICD-10-CM | POA: Insufficient documentation

## 2013-05-12 DIAGNOSIS — I10 Essential (primary) hypertension: Secondary | ICD-10-CM | POA: Insufficient documentation

## 2013-05-12 DIAGNOSIS — G459 Transient cerebral ischemic attack, unspecified: Secondary | ICD-10-CM

## 2013-05-12 DIAGNOSIS — Z8673 Personal history of transient ischemic attack (TIA), and cerebral infarction without residual deficits: Secondary | ICD-10-CM | POA: Insufficient documentation

## 2013-05-12 DIAGNOSIS — I2510 Atherosclerotic heart disease of native coronary artery without angina pectoris: Secondary | ICD-10-CM | POA: Diagnosis present

## 2013-05-12 DIAGNOSIS — R4701 Aphasia: Secondary | ICD-10-CM | POA: Insufficient documentation

## 2013-05-12 DIAGNOSIS — I249 Acute ischemic heart disease, unspecified: Secondary | ICD-10-CM

## 2013-05-12 DIAGNOSIS — Z9861 Coronary angioplasty status: Secondary | ICD-10-CM | POA: Insufficient documentation

## 2013-05-12 HISTORY — DX: Unspecified lump in unspecified breast: N63.0

## 2013-05-12 HISTORY — DX: Unspecified osteoarthritis, unspecified site: M19.90

## 2013-05-12 HISTORY — DX: Family history of other specified conditions: Z84.89

## 2013-05-12 HISTORY — DX: Transient cerebral ischemic attack, unspecified: G45.9

## 2013-05-12 HISTORY — DX: Type 2 diabetes mellitus without complications: E11.9

## 2013-05-12 HISTORY — DX: Exudative age-related macular degeneration, unspecified eye, stage unspecified: H35.3290

## 2013-05-12 LAB — CBC
MCH: 31.5 pg (ref 26.0–34.0)
MCHC: 34.7 g/dL (ref 30.0–36.0)
MCV: 90.6 fL (ref 78.0–100.0)
Platelets: 281 10*3/uL (ref 150–400)
RDW: 12.4 % (ref 11.5–15.5)

## 2013-05-12 LAB — COMPREHENSIVE METABOLIC PANEL
ALT: 12 U/L (ref 0–35)
AST: 20 U/L (ref 0–37)
CO2: 26 mEq/L (ref 19–32)
Calcium: 9.7 mg/dL (ref 8.4–10.5)
Creatinine, Ser: 1.06 mg/dL (ref 0.50–1.10)
GFR calc non Af Amer: 48 mL/min — ABNORMAL LOW (ref >90)
Sodium: 140 mEq/L (ref 135–145)
Total Protein: 7.4 g/dL (ref 6.0–8.3)

## 2013-05-12 LAB — POCT I-STAT, CHEM 8
BUN: 27 mg/dL — ABNORMAL HIGH (ref 6–23)
Chloride: 106 mEq/L (ref 96–112)
Glucose, Bld: 137 mg/dL — ABNORMAL HIGH (ref 70–99)
HCT: 39 % (ref 36.0–46.0)
Potassium: 4.4 mEq/L (ref 3.5–5.1)

## 2013-05-12 LAB — POCT I-STAT TROPONIN I: Troponin i, poc: 0.01 ng/mL (ref 0.00–0.08)

## 2013-05-12 MED ORDER — ENOXAPARIN SODIUM 80 MG/0.8ML ~~LOC~~ SOLN
80.0000 mg | Freq: Once | SUBCUTANEOUS | Status: DC
  Filled 2013-05-12: qty 0.8

## 2013-05-12 MED ORDER — HEPARIN SODIUM (PORCINE) 5000 UNIT/ML IJ SOLN: 4000.0000 [IU] | Freq: Once | INTRAMUSCULAR | Status: DC

## 2013-05-12 MED ORDER — NITROGLYCERIN 0.4 MG SL SUBL: 0.4000 mg | SUBLINGUAL_TABLET | SUBLINGUAL | Status: DC | PRN

## 2013-05-12 MED ORDER — HEPARIN (PORCINE) IN NACL 100-0.45 UNIT/ML-% IJ SOLN: 900.0000 [IU]/h | Freq: Once | INTRAMUSCULAR | Status: DC

## 2013-05-12 MED ORDER — ASPIRIN 81 MG PO CHEW: 324.0000 mg | CHEWABLE_TABLET | Freq: Once | ORAL | Status: DC

## 2013-05-12 NOTE — ED Notes (Signed)
Pt transported to Xray. 

## 2013-05-12 NOTE — ED Notes (Signed)
New and old EKG given to Dr. Ethelda Chick. Copy placed in pt chart.

## 2013-05-12 NOTE — ED Notes (Addendum)
Pt pointed to right chest, under right breast, where bra was located, after this RN removed her bra, pain dissipated. Pt states "Oh, it's gone, maybe my bra was just too tight."

## 2013-05-12 NOTE — ED Notes (Signed)
Per ems- pt d/c from hospital yesterday, pt stated that tonight she ate dinner and then developed abd pain which then radiated into her chest. Pain was in right chest at one point and under left breast at another point. Pt states "i have a few chest pains, right now its under my right breast." NAD noted at this time. VSS, Bp-198/81 HR-73, NSR, unremarkable, RR-20 O2-94%

## 2013-05-12 NOTE — ED Provider Notes (Signed)
Complains of anterior chest discomfort. She is pain-free as I interview her. Pain is worse with exertion and improved with rest. Patient discharged from here yesterday she was to have an outpatient stress test. Return if she had more discomfort tonight.  Doug Sou, MD 05/12/13 2311

## 2013-05-12 NOTE — ED Provider Notes (Signed)
History     CSN: [id number]  Arrival date & time 05/12/13  2004   First MD Initiated Contact with Patient 05/12/13 2055      Chief Complaint  Patient presents with  . Chest Pain    Patient is a 79 y.o. female presenting with chest pain.  Chest Pain Pain location:  Substernal area, L chest and R chest Pain quality: aching, pressure and tightness   Pain radiates to:  Does not radiate Pain radiates to the back: no   Pain severity:  Moderate Onset quality:  Gradual Timing:  Intermittent Progression:  Unchanged Chronicity:  Recurrent Context: at rest   Relieved by:  Nothing Worsened by:  Exertion Ineffective treatments:  None tried Associated symptoms: no abdominal pain, no altered mental status, no back pain, no cough, no diaphoresis, no dizziness, no fever, no headache, no lower extremity edema, no nausea, no numbness, no orthopnea, no palpitations, no PND, no shortness of breath, no syncope, not vomiting and no weakness   Risk factors: coronary artery disease (had a stent placed in 2004), diabetes mellitus, high cholesterol, hypertension and smoking (former smoker)   Risk factors: no prior DVT/PE    She was admitted here for chest pain earlier in the week and workup included serial troponins, which were negative.  Outpatient stress test was arranged with cardiology, which she has not had yet.   Past Medical History  Diagnosis Date  . CAD (coronary artery disease)     a. 2004 Cath/PCI with stenting x 1 in [place].  . Hypertension   . Hyperlipemia   . History of tobacco abuse     a. 50 pack yr hx, quit 2004  . History of shingles     a. left leg with residual pain.  . Peripheral neuropathy   . Hard of hearing   . Fibrous breast lumps     "both sides; I did not have cancer" (05/13/2013)  . Family history of anesthesia complication     "took a long time to wake my mother up after one of her surgeries" (05/13/2013)  . Macular degeneration, wet     "both eyes" (05/13/2013)   . Type II diabetes mellitus   . Anginal pain   . Myocardial infarction     "think that's why I got the stent in 2004" (05/13/2013)  . Asthma     "it went away" (05/13/2013)  . History of chronic bronchitis     "not so much anymore" (05/13/2013)  . Arthritis     "hands; maybe feet" (05/13/2013)    Past Surgical History  Procedure Laterality Date  . Tonsillectomy    . Appendectomy    . Carpal tunnel release Bilateral   . Mastectomy Bilateral 1980's    "multiple fibrous cysts; never had cancer" (05/13/2013)20  . Cesarean section  1960; 1962  . Inner ear surgery Left 1962    "put a piece of steel and a tube & a vein from my ?left hand in" (05/13/2013)  . Cataract extraction w/ intraocular lens  implant, bilateral Bilateral 2000  . Breast biopsy    . Tubal ligation  1963  . Coronary angioplasty with stent placement  2004    "1" (05/13/2013)    Family History  Problem Relation Age of Onset  . Stroke Father     deceased  . Other Mother     deceased - possibly renal failure    History  Substance Use Topics  . Smoking status: Former Smoker -- 1.00  packs/day for 50 years    Types: Cigarettes    Quit date: 05/12/2003  . Smokeless tobacco: Never Used  . Alcohol Use: Yes     Comment: 05/13/2013 "socially; very seldom anymore"    OB History   Grav Para Term Preterm Abortions TAB SAB Ect Mult Living                  Review of Systems  Constitutional: Negative for fever, chills and diaphoresis.  HENT: Negative for congestion, rhinorrhea, neck pain and neck stiffness.   Respiratory: Negative for cough, shortness of breath and wheezing.   Cardiovascular: Positive for chest pain. Negative for palpitations, orthopnea, leg swelling, syncope and PND.  Gastrointestinal: Negative for nausea, vomiting, abdominal pain and diarrhea.  Genitourinary: Negative for dysuria, urgency, frequency, flank pain, vaginal bleeding, vaginal discharge and difficulty urinating.  Musculoskeletal: Negative  for back pain.  Skin: Negative for rash.  Neurological: Negative for dizziness, weakness, numbness and headaches.  Psychiatric/Behavioral: Negative for altered mental status.  All other systems reviewed and are negative.    Allergies  Review of patient's allergies indicates no known allergies.  Home Medications   Current Outpatient Rx  Name  Route  Sig  Dispense  Refill  . acetaminophen (TYLENOL) 325 MG tablet   Oral   Take 325 mg by mouth every 6 (six) hours as needed for pain.         Marland Kitchen alendronate (FOSAMAX) 35 MG tablet   Oral   Take 35 mg by mouth every 7 (seven) days. fridays         . aspirin 325 MG buffered tablet   Oral   Take 325 mg by mouth daily.         . bimatoprost (LUMIGAN) 0.01 % SOLN   Both Eyes   Place 2 drops into both eyes daily.          . calcium-vitamin D (OSCAL WITH D) 500-200 MG-UNIT per tablet   Oral   Take 1 tablet by mouth daily.         . cloNIDine (CATAPRES) 0.1 MG tablet   Oral   Take 0.1 mg by mouth 2 (two) times daily.         Marland Kitchen docusate sodium (COLACE) 100 MG capsule   Oral   Take 100 mg by mouth 2 (two) times daily.         Marland Kitchen gabapentin (NEURONTIN) 300 MG capsule   Oral   Take 300 mg by mouth at bedtime.         . isosorbide mononitrate (IMDUR) 30 MG 24 hr tablet   Oral   Take 1 tablet (30 mg total) by mouth daily.   30 tablet   0   . loratadine (CLARITIN) 10 MG tablet   Oral   Take 10 mg by mouth daily.         . meloxicam (MOBIC) 15 MG tablet   Oral   Take 15 mg by mouth daily.         . metFORMIN (GLUCOPHAGE) 1000 MG tablet   Oral   Take 1,000 mg by mouth daily with breakfast.         . Multiple Vitamin (TAB-A-VITE PO)   Oral   Take 1 tablet by mouth daily.         . Multiple Vitamins-Minerals (I-VITE PO)   Oral   Take 1 tablet by mouth daily.         . nebivolol (BYSTOLIC) 5  MG tablet   Oral   Take 5 mg by mouth daily.         . polyvinyl alcohol (LIQUIFILM TEARS) 1.4 %  ophthalmic solution   Both Eyes   Place 1 drop into both eyes 6 (six) times daily.         Marland Kitchen senna (SENOKOT) 8.6 MG TABS   Oral   Take 1 tablet by mouth daily.         . simvastatin (ZOCOR) 10 MG tablet   Oral   Take 10 mg by mouth at bedtime.           BP 192/67  Pulse 76  Temp(Src) 97.7 F (36.5 C) (Axillary)  Resp 19  SpO2 95%  Physical Exam  Nursing note and vitals reviewed. Constitutional: She is oriented to person, place, and time. She appears well-developed and well-nourished. No distress.  HENT:  Head: Normocephalic and atraumatic.  Mouth/Throat: Oropharynx is clear and moist.  Eyes: Conjunctivae and EOM are normal. Pupils are equal, round, and reactive to light. No scleral icterus.  Neck: Normal range of motion. Neck supple. No JVD present.  Cardiovascular: Normal rate, regular rhythm, normal heart sounds and intact distal pulses.  Exam reveals no gallop and no friction rub.   No murmur heard. Pulmonary/Chest: Effort normal and breath sounds normal. No respiratory distress. She has no wheezes. She has no rales.  Abdominal: Soft. Bowel sounds are normal. She exhibits no distension. There is no tenderness. There is no rebound and no guarding.  Musculoskeletal: She exhibits no edema.  Neurological: She is alert and oriented to person, place, and time. No cranial nerve deficit. She exhibits normal muscle tone. Coordination normal.  Skin: Skin is warm and dry. She is not diaphoretic.    ED Course  Procedures (including critical care time)  Labs Reviewed  COMPREHENSIVE METABOLIC PANEL - Abnormal; Notable for the following:    Glucose, Bld 137 (*)    BUN 25 (*)    Total Bilirubin 0.2 (*)    GFR calc non Af Amer 48 (*)    GFR calc Af Amer 56 (*)    All other components within normal limits  POCT I-STAT, CHEM 8 - Abnormal; Notable for the following:    BUN 27 (*)    Glucose, Bld 137 (*)    All other components within normal limits  CBC  TROPONIN I  POCT  I-STAT TROPONIN I   Dg Chest 2 View  05/12/2013   *RADIOLOGY REPORT*  Clinical Data: Chest pain  CHEST - 2 VIEW  Comparison: Chest radiograph 05/10/2013  Findings: Normal mediastinum and heart silhouette.  Lungs are clear. Degenerative osteophytosis of the thoracic spine. Mild chronic bronchitic markings centrally.  IMPRESSION: No interval change.  Mild chronic bronchitic markings.   Original Report Authenticated By: Genevive Bi, M.D.    Date: 05/12/2013  Rate: 74  Rhythm: normal sinus rhythm  QRS Axis: normal  Intervals: normal  ST/T Wave abnormalities: normal  Conduction Disutrbances:none  Narrative Interpretation:   Old EKG Reviewed: unchanged    1. Chest pain   2. ACS (acute coronary syndrome)       MDM  27:29 PM 79 year old female with a history of coronary artery disease status post heart catheterization and stenting in 2004 presents complaining of chest pain. Chest pain is concerning for ACS. She was recently admitted here with the same complaint and felt likely to have unstable angina by cardiology, but provocative testing was not performed during the admission  and instead outpatient stress test was arranged. She returns today with worse chest pain than before.   She is moderately hypertensive to the 170s systolic and otherwise her vitals are within normal limits. Her cardiopulmonary exam is unremarkable. She has 2+ bilateral radial and bilateral DP pulses. She has a nonischemic EKG and i-STAT troponin is normal x1. I feel that she needs to have the stress test previously planned by cardiology and monitoring on an inpatient basis until workup is complete.  She was given aspirin, sublingual nitroglycerin. The internal medicine consultant asked that I hold heparin  11:31 PM: I have consulted both cardiology (Dr. Terressa Koyanagi) and [facility] (Dr. Julian Reil).  Both specialists agree with me that the patient needs a stress test and this is likely anginal pain.  However, the  cardiologist states she should be admitted to medicine, as she was just on medicine service.  Dr. Julian Reil states he will not admit the patient, as he thinks she needs a stress test and only the cardiology service can perform a stress test.  At this time, I have requested that the two specialists have a conversation and arrive at the best course of action.  12:30 AM: Patient was admitted to medicine for monitoring and further workup.        Toney Sang, MD 05/13/13 [phone]

## 2013-05-13 ENCOUNTER — Encounter (HOSPITAL_COMMUNITY): Payer: Self-pay | Admitting: General Practice

## 2013-05-13 ENCOUNTER — Observation Stay (HOSPITAL_COMMUNITY): Payer: Medicare PPO

## 2013-05-13 DIAGNOSIS — R079 Chest pain, unspecified: Secondary | ICD-10-CM

## 2013-05-13 DIAGNOSIS — E119 Type 2 diabetes mellitus without complications: Secondary | ICD-10-CM

## 2013-05-13 DIAGNOSIS — G459 Transient cerebral ischemic attack, unspecified: Secondary | ICD-10-CM

## 2013-05-13 DIAGNOSIS — I2510 Atherosclerotic heart disease of native coronary artery without angina pectoris: Secondary | ICD-10-CM

## 2013-05-13 DIAGNOSIS — R072 Precordial pain: Secondary | ICD-10-CM

## 2013-05-13 LAB — BASIC METABOLIC PANEL
BUN: 22 mg/dL (ref 6–23)
CO2: 23 mEq/L (ref 19–32)
Calcium: 9 mg/dL (ref 8.4–10.5)
Glucose, Bld: 140 mg/dL — ABNORMAL HIGH (ref 70–99)
Sodium: 139 mEq/L (ref 135–145)

## 2013-05-13 LAB — CBC
HCT: 36.3 % (ref 36.0–46.0)
Hemoglobin: 12.3 g/dL (ref 12.0–15.0)
MCH: 31.1 pg (ref 26.0–34.0)
MCHC: 33.9 g/dL (ref 30.0–36.0)
MCV: 91.7 fL (ref 78.0–100.0)
RBC: 3.96 MIL/uL (ref 3.87–5.11)

## 2013-05-13 LAB — HEMOGLOBIN A1C: Mean Plasma Glucose: 146 mg/dL — ABNORMAL HIGH (ref <117)

## 2013-05-13 LAB — GLUCOSE, CAPILLARY
Glucose-Capillary: 150 mg/dL — ABNORMAL HIGH (ref 70–99)
Glucose-Capillary: 124 mg/dL — ABNORMAL HIGH (ref 70–99)
Glucose-Capillary: 132 mg/dL — ABNORMAL HIGH (ref 70–99)

## 2013-05-13 MED ORDER — LORATADINE 10 MG PO TABS
10.0000 mg | ORAL_TABLET | Freq: Every day | ORAL | Status: DC
  Administered 2013-05-13 – 2013-05-14 (×2): 10 mg via ORAL
  Filled 2013-05-13 (×2): qty 1

## 2013-05-13 MED ORDER — BIMATOPROST 0.01 % OP SOLN
2.0000 [drp] | Freq: Every day | OPHTHALMIC | Status: DC
  Administered 2013-05-13: 2 [drp] via OPHTHALMIC
  Filled 2013-05-13: qty 2.5

## 2013-05-13 MED ORDER — SODIUM CHLORIDE 0.9 % IV SOLN: INTRAVENOUS | Status: DC

## 2013-05-13 MED ORDER — ASPIRIN 325 MG PO TABS
325.0000 mg | ORAL_TABLET | Freq: Every day | ORAL | Status: DC
  Administered 2013-05-13: 325 mg via ORAL
  Filled 2013-05-13 (×2): qty 1

## 2013-05-13 MED ORDER — ASPIRIN BUFFERED 325 MG PO TABS: 325.0000 mg | ORAL_TABLET | Freq: Every day | ORAL | Status: DC

## 2013-05-13 MED ORDER — ACETAMINOPHEN 325 MG PO TABS
325.0000 mg | ORAL_TABLET | Freq: Four times a day (QID) | ORAL | Status: DC | PRN
  Administered 2013-05-14: 325 mg via ORAL
  Filled 2013-05-13: qty 1

## 2013-05-13 MED ORDER — ISOSORBIDE MONONITRATE ER 30 MG PO TB24
30.0000 mg | ORAL_TABLET | Freq: Every day | ORAL | Status: DC
  Filled 2013-05-13: qty 1

## 2013-05-13 MED ORDER — NEBIVOLOL HCL 5 MG PO TABS
5.0000 mg | ORAL_TABLET | Freq: Every day | ORAL | Status: DC
  Filled 2013-05-13: qty 1

## 2013-05-13 MED ORDER — GABAPENTIN 300 MG PO CAPS
300.0000 mg | ORAL_CAPSULE | Freq: Every day | ORAL | Status: DC
  Administered 2013-05-13 (×2): 300 mg via ORAL
  Filled 2013-05-13 (×3): qty 1

## 2013-05-13 MED ORDER — HEPARIN SODIUM (PORCINE) 5000 UNIT/ML IJ SOLN
5000.0000 [IU] | Freq: Three times a day (TID) | INTRAMUSCULAR | Status: DC
  Administered 2013-05-13 – 2013-05-14 (×4): 5000 [IU] via SUBCUTANEOUS
  Filled 2013-05-13 (×9): qty 1

## 2013-05-13 MED ORDER — POLYVINYL ALCOHOL 1.4 % OP SOLN
1.0000 [drp] | Freq: Every day | OPHTHALMIC | Status: DC
  Administered 2013-05-13 – 2013-05-14 (×2): 1 [drp] via OPHTHALMIC
  Filled 2013-05-13: qty 15

## 2013-05-13 MED ORDER — SIMVASTATIN 10 MG PO TABS
10.0000 mg | ORAL_TABLET | Freq: Every day | ORAL | Status: DC
  Administered 2013-05-13: 10 mg via ORAL
  Filled 2013-05-13 (×2): qty 1

## 2013-05-13 MED ORDER — CLONIDINE HCL 0.1 MG PO TABS
0.1000 mg | ORAL_TABLET | Freq: Two times a day (BID) | ORAL | Status: DC
  Administered 2013-05-13 – 2013-05-14 (×4): 0.1 mg via ORAL
  Filled 2013-05-13 (×5): qty 1

## 2013-05-13 MED ORDER — IOHEXOL 350 MG/ML SOLN
50.0000 mL | Freq: Once | INTRAVENOUS | Status: AC | PRN
  Administered 2013-05-13: 50 mL via INTRAVENOUS

## 2013-05-13 MED ORDER — SENNA 8.6 MG PO TABS
1.0000 | ORAL_TABLET | Freq: Every day | ORAL | Status: DC
  Administered 2013-05-14: 8.6 mg via ORAL
  Filled 2013-05-13 (×2): qty 1

## 2013-05-13 MED ORDER — INSULIN ASPART 100 UNIT/ML ~~LOC~~ SOLN: 0.0000 [IU] | Freq: Three times a day (TID) | SUBCUTANEOUS | Status: DC

## 2013-05-13 MED ORDER — DOCUSATE SODIUM 100 MG PO CAPS
100.0000 mg | ORAL_CAPSULE | Freq: Two times a day (BID) | ORAL | Status: DC
  Administered 2013-05-13 – 2013-05-14 (×2): 100 mg via ORAL
  Filled 2013-05-13 (×4): qty 1

## 2013-05-13 MED ORDER — MELOXICAM 15 MG PO TABS
15.0000 mg | ORAL_TABLET | Freq: Every day | ORAL | Status: DC
  Administered 2013-05-13 – 2013-05-14 (×2): 15 mg via ORAL
  Filled 2013-05-13 (×2): qty 1

## 2013-05-13 MED ORDER — SODIUM CHLORIDE 0.9 % IJ SOLN
3.0000 mL | Freq: Two times a day (BID) | INTRAMUSCULAR | Status: DC
  Administered 2013-05-13 (×2): 3 mL via INTRAVENOUS
  Filled 2013-05-13: qty 3

## 2013-05-13 NOTE — Consult Note (Signed)
Referring Physician: Mahala Menghini    Chief Complaint: expressive aphasia  HPI:                                                                                                                                         Judy Perez is an 79 y.o. female who was admitted for CP. Last night she was feeling normal.  She awoke this am and noted inability to get her thoughts out.  She did not move thus cannot tell us if she was weak of one side or another.  She also felt confused and scared.  She waited until the nurse entered the room and then explained to her she felt as though she was not speaking right. "She tried to get out a "Ch-" word, then stated, "my tongue is thick. My tongue is thick." I asked her where she lived and she said "I know where I live, but it's like I can't get the words to come out of my mouth." She then went on to answer other questions lucidly, but at slow rate of speech. Nursing spoke with the son who confirmed this was unusual for her."  Currently she has resolved but is still very anxious.  Again, no weakness or paresthesia was noted by the patient, no blurred vision.    Date last known well: 5.28.14 Time last known well: evening of 5.28.14 (unknown exact time) tPA Given: No: resolved  Past Medical History  Diagnosis Date  . CAD (coronary artery disease)     a. 2004 Cath/PCI with stenting x 1 in [place].  . Hypertension   . Hyperlipemia   . History of tobacco abuse     a. 50 pack yr hx, quit 2004  . History of shingles     a. left leg with residual pain.  . Peripheral neuropathy   . Hard of hearing   . Fibrous breast lumps     "both sides; I did not have cancer" (05/13/2013)  . Family history of anesthesia complication     "took a long time to wake my mother up after one of her surgeries" (05/13/2013)  . Macular degeneration, wet     "both eyes" (05/13/2013)  . Type II diabetes mellitus   . Arthritis     "hands; maybe feet" (05/13/2013)  . TIA (transient ischemic  attack)     Past Surgical History  Procedure Laterality Date  . Tonsillectomy    . Appendectomy    . Carpal tunnel release Bilateral   . Mastectomy Bilateral 1980's    "multiple fibrous cysts; never had cancer" (05/13/2013)20  . Cesarean section  1960; 1962  . Inner ear surgery Left 1962    "put a piece of steel and a tube & a vein from my ?left hand in" (05/13/2013)  . Cataract extraction w/ intraocular lens  implant, bilateral Bilateral 2000  . Breast biopsy    .  Tubal ligation  1963  . Coronary angioplasty with stent placement  2004    "1" (05/13/2013)    Family History  Problem Relation Age of Onset  . Stroke Father     deceased  . Other Mother     deceased - possibly renal failure   Social History:  reports that she quit smoking about 10 years ago. Her smoking use included Cigarettes. She has a 50 pack-year smoking history. She has never used smokeless tobacco. She reports that  drinks alcohol. She reports that she does not use illicit drugs.  Allergies: No Known Allergies  Medications:                                                                                                                           Scheduled: . aspirin  324 mg Oral Once  . aspirin  325 mg Oral Daily  . bimatoprost  2 drop Both Eyes Daily  . cloNIDine  0.1 mg Oral BID  . docusate sodium  100 mg Oral BID  . gabapentin  300 mg Oral QHS  . heparin  5,000 Units Subcutaneous Q8H  . insulin aspart  0-9 Units Subcutaneous TID WC  . loratadine  10 mg Oral Daily  . meloxicam  15 mg Oral Daily  . polyvinyl alcohol  1 drop Both Eyes 6 X Daily  . senna  1 tablet Oral Daily  . simvastatin  10 mg Oral q1800  . sodium chloride  3 mL Intravenous Q12H    ROS:                                                                                                                                       History obtained from the patient  General ROS: negative for - chills, fatigue, fever, night sweats, weight gain or  weight loss Psychological ROS: negative for - behavioral disorder, hallucinations, memory difficulties, mood swings or suicidal ideation Ophthalmic ROS: negative for - blurry vision, double vision, eye pain or loss of vision ENT ROS: negative for - epistaxis, nasal discharge, oral lesions, sore throat, tinnitus or vertigo Allergy and Immunology ROS: negative for - hives or itchy/watery eyes Hematological and Lymphatic ROS: negative for - bleeding problems, bruising or swollen lymph nodes Endocrine ROS: negative for - galactorrhea, hair pattern changes, polydipsia/polyuria or temperature intolerance Respiratory ROS: negative for - cough, hemoptysis, shortness of  breath or wheezing Cardiovascular ROS: negative for - chest pain, dyspnea on exertion, edema or irregular heartbeat Gastrointestinal ROS: negative for - abdominal pain, diarrhea, hematemesis, nausea/vomiting or stool incontinence Genito-Urinary ROS: negative for - dysuria, hematuria, incontinence or urinary frequency/urgency Musculoskeletal ROS: negative for - joint swelling or muscular weakness Neurological ROS: as noted in HPI Dermatological ROS: negative for rash and skin lesion changes  Neurologic Examination:                                                                                                      Blood pressure 157/77, pulse 70, temperature 98.7 F (37.1 C), temperature source Oral, resp. rate 18, weight 75.8 kg (167 lb 1.7 oz), SpO2 93.00%.  Mental Status: Alert, oriented, thought content appropriate.  Speech fluent without evidence of aphasia.  Able to follow 3 step commands without difficulty. Cranial Nerves: II: Discs flat bilaterally; Visual fields grossly normal, pupils equal, round, reactive to light and accommodation III,IV, VI: ptosis not present, extra-ocular motions intact bilaterally V,VII: smile symmetric, facial light touch sensation normal bilaterally VIII: hearing decreased on the right IX,X: gag  reflex present XI: bilateral shoulder shrug XII: midline tongue extension Motor: Right : Upper extremity   5/5    Left:     Upper extremity   5/5  Lower extremity   5/5     Lower extremity   5/5 Tone and bulk:normal tone throughout; no atrophy noted Sensory: Pinprick and light touch intact throughout, bilaterally but right arm is less sensative Deep Tendon Reflexes: 2+ and symmetric throughout UE, 1+ KJ and no AJ Plantars: Right: downgoing   Left: downgoing Cerebellar: normal finger-to-nose,  normal heel-to-shin test CV: pulses palpable throughout    Results for orders placed during the hospital encounter of 05/12/13 (from the past 48 hour(s))  CBC     Status: None   Collection Time    05/12/13  9:42 PM      Result Value Range   WBC 8.9  4.0 - 10.5 K/uL   RBC 4.16  3.87 - 5.11 MIL/uL   Hemoglobin 13.1  12.0 - 15.0 g/dL   HCT 16.1  09.6 - 04.5 %   MCV 90.6  78.0 - 100.0 fL   MCH 31.5  26.0 - 34.0 pg   MCHC 34.7  30.0 - 36.0 g/dL   RDW 40.9  81.1 - 91.4 %   Platelets 281  150 - 400 K/uL  COMPREHENSIVE METABOLIC PANEL     Status: Abnormal   Collection Time    05/12/13  9:42 PM      Result Value Range   Sodium 140  135 - 145 mEq/L   Potassium 4.6  3.5 - 5.1 mEq/L   Chloride 102  96 - 112 mEq/L   CO2 26  19 - 32 mEq/L   Glucose, Bld 137 (*) 70 - 99 mg/dL   BUN 25 (*) 6 - 23 mg/dL   Creatinine, Ser 7.82  0.50 - 1.10 mg/dL   Calcium 9.7  8.4 - 95.6 mg/dL   Total Protein 7.4  6.0 -  8.3 g/dL   Albumin 3.9  3.5 - 5.2 g/dL   AST 20  0 - 37 U/L   ALT 12  0 - 35 U/L   Alkaline Phosphatase 80  39 - 117 U/L   Total Bilirubin 0.2 (*) 0.3 - 1.2 mg/dL   GFR calc non Af Amer 48 (*) >90 mL/min   GFR calc Af Amer 56 (*) >90 mL/min   Comment:            The eGFR has been calculated     using the CKD EPI equation.     This calculation has not been     validated in all clinical     situations.     eGFR's persistently     <90 mL/min signify     possible Chronic Kidney Disease.  POCT  I-STAT TROPONIN I     Status: None   Collection Time    05/12/13  9:53 PM      Result Value Range   Troponin i, poc 0.01  0.00 - 0.08 ng/mL   Comment 3            Comment: Due to the release kinetics of cTnI,     a negative result within the first hours     of the onset of symptoms does not rule out     myocardial infarction with certainty.     If myocardial infarction is still suspected,     repeat the test at appropriate intervals.  POCT I-STAT, CHEM 8     Status: Abnormal   Collection Time    05/12/13  9:55 PM      Result Value Range   Sodium 141  135 - 145 mEq/L   Potassium 4.4  3.5 - 5.1 mEq/L   Chloride 106  96 - 112 mEq/L   BUN 27 (*) 6 - 23 mg/dL   Creatinine, Ser 1.61  0.50 - 1.10 mg/dL   Glucose, Bld 096 (*) 70 - 99 mg/dL   Calcium, Ion 0.45  4.09 - 1.30 mmol/L   TCO2 28  0 - 100 mmol/L   Hemoglobin 13.3  12.0 - 15.0 g/dL   HCT 81.1  91.4 - 78.2 %  TROPONIN I     Status: None   Collection Time    05/13/13 12:38 AM      Result Value Range   Troponin I <0.30  <0.30 ng/mL   Comment:            Due to the release kinetics of cTnI,     a negative result within the first hours     of the onset of symptoms does not rule out     myocardial infarction with certainty.     If myocardial infarction is still suspected,     repeat the test at appropriate intervals.  GLUCOSE, CAPILLARY     Status: Abnormal   Collection Time    05/13/13  4:10 AM      Result Value Range   Glucose-Capillary 150 (*) 70 - 99 mg/dL   Comment 1 Notify RN    CBC     Status: None   Collection Time    05/13/13  6:15 AM      Result Value Range   WBC 8.0  4.0 - 10.5 K/uL   RBC 3.96  3.87 - 5.11 MIL/uL   Hemoglobin 12.3  12.0 - 15.0 g/dL   HCT 95.6  21.3 - 08.6 %   MCV  91.7  78.0 - 100.0 fL   MCH 31.1  26.0 - 34.0 pg   MCHC 33.9  30.0 - 36.0 g/dL   RDW 91.4  78.2 - 95.6 %   Platelets 258  150 - 400 K/uL  BASIC METABOLIC PANEL     Status: Abnormal   Collection Time    05/13/13  6:15 AM       Result Value Range   Sodium 139  135 - 145 mEq/L   Potassium 4.1  3.5 - 5.1 mEq/L   Chloride 104  96 - 112 mEq/L   CO2 23  19 - 32 mEq/L   Glucose, Bld 140 (*) 70 - 99 mg/dL   BUN 22  6 - 23 mg/dL   Creatinine, Ser 2.13  0.50 - 1.10 mg/dL   Calcium 9.0  8.4 - 08.6 mg/dL   GFR calc non Af Amer 53 (*) >90 mL/min   GFR calc Af Amer 61 (*) >90 mL/min   Comment:            The eGFR has been calculated     using the CKD EPI equation.     This calculation has not been     validated in all clinical     situations.     eGFR's persistently     <90 mL/min signify     possible Chronic Kidney Disease.  GLUCOSE, CAPILLARY     Status: Abnormal   Collection Time    05/13/13  7:35 AM      Result Value Range   Glucose-Capillary 124 (*) 70 - 99 mg/dL   Dg Chest 2 View  5/78/4696   *RADIOLOGY REPORT*  Clinical Data: Chest pain  CHEST - 2 VIEW  Comparison: Chest radiograph 05/10/2013  Findings: Normal mediastinum and heart silhouette.  Lungs are clear. Degenerative osteophytosis of the thoracic spine. Mild chronic bronchitic markings centrally.  IMPRESSION: No interval change.  Mild chronic bronchitic markings.   Original Report Authenticated By: Genevive Bi, M.D.    Assessment and plan discussed with with attending physician and they are in agreement.    Felicie Morn PA-C [facility] [phone]  05/13/2013, 8:59 AM  I have seen and evaluated the patient. I have reviewed the above note and made appropriate changes.    Assessment: 80 y.o. female with transient difficulty with word finding this morning. It is much improved, though not clearly completely resolved. I suspect that she had a TIA this morning, especially given her multiple risk factors for this. An EEG to rule out seizures   Stroke Risk Factors - diabetes mellitus, hyperlipidemia and hypertension  Plan: 1. HgbA1c, fasting lipid panel 2. CTA head and neck 3. PT consult, OT consult, Speech consult 4.  Echocardiogram 6. Prophylactic therapy-Antiplatelet med: Aspirin - dose 325 daily 7. Risk factor modification 8. Telemetry monitoring 9. Frequent neuro checks 10.EEG  Ritta Slot, MD [facility] [phone]  If 7pm- 7am, please page neurology on call at [phone].

## 2013-05-13 NOTE — Progress Notes (Signed)
Nutrition Brief Note  Patient identified on the Malnutrition Screening Tool (MST) Report for recent weight loss without trying.  Per weight records, patient's weight has been stable.  Wt Readings from Last 20 Encounters:  05/13/13 167 lb 1.7 oz (75.8 kg)  05/11/13 171 lb 11.8 oz (77.9 kg)    Body mass index is 31.59 kg/(m^2). Patient meets criteria for Obesity Class I based on current BMI.   Current diet order is Carbohydrate Modified Medium Calorie. Labs and medications reviewed.   No nutrition interventions warranted at this time. If nutrition issues arise, please consult RD.   Maureen Chatters, RD, LDN Pager #: [phone] After-Hours Pager #: [phone]

## 2013-05-13 NOTE — Progress Notes (Signed)
8:22 AM I agree with HPI/GPe and A/P per Dr. Julian Reil.  Patient re-admitted with CP earlier this am Informed by RN for nurse as well as Corinda Gubler PA who was at bedside that patient seemed very groggy and had a "thick tongue".  Patient ambulated to the BEdside commode without issue, but seemed hunched over per report.  She was emotional on my coming to room and "scared" Please see Ms. Telford Nab excellent note of the event. She is currently non-focal without specific deficit.  She remains a little confused And per report from Ms. Dunn, her history is now very inconsistent      HEENT-No slurring speech.  No drooping of mouth CHEST-clear CARDIAC-s1 s2 no m/r/g.  Cannot appreciate a bruit ABDOMEN-soft, NT/Nd NEURO-Moves all 4 limbs equally.  Power 5/5.  Coordination intact.  Reflexes 2/3.  Sensory normal.  Uvula midline.  Cerebellar signs intact.  No downgoing plantars.  No discernible facial droop  Patient Active Problem List   Diagnosis Date Noted  . Chest pain 05/11/2013  . DM2 (diabetes mellitus, type 2) 05/11/2013  . CAD (coronary artery disease) 05/11/2013   A/p 1. ?TIA-Neuro on call paged  Recommend since outside Tpa window [was asleep when last normal]-She has an inner ear metallic implant and cannot get MRI head, so I have ordered a CT angiogram of the head and carotid dopplers as she states she has had prior TIa's last year in 08/2012 when she was living in [place] and was told she had inoperable Carotid stenosis.  She will continue ASA 325 for now until CTa performed 2. CP-H/o CAD s/p stentingWill eventually need stress test-appreciate cardiology input and care in her case 3. Htn-Given unsure of diagnosis in terms of whether this is a CVA vs TIA, will hold some of her blood pressure medicines including Imdur 30 and Bystolic 5 mg  4. DM ty 2-qid-achs coverage 5. HLD-Get Fasting lipid panel 6. Anxiety-some inconsistencies on exam in addition to mentation per Cardiolgoy PA's note.  Unclear  if she also might have some undiagnosed dementia or just anxiety from upcoming test.  Will monitor 7. Mild COPD-stable currently 8. Deafness-Has a implant 9. Macular degeneration-severe.  Has injections for this.   Spoke with patient's son on cell [phone].  He is not aware of her having Tia's   Pleas Koch, MD [facility] [phone]

## 2013-05-13 NOTE — Procedures (Signed)
History: 79 yo F with transient expressive aphasia this morning  Sedation: None  Background: The beginning and majority of this EEG are recorded during sleep with normal appearing sleep structures. Once awake, the background consists of intermixed alpha and beta frequencies. There is a well defined posterior dominant rhythm of 8.5 Hz that attenuates with eye opening.   Photic stimulation: Physiologic driving is not performed  EEG Abnormalities: None  Clinical Interpretation: This normal EEG is recorded in the waking and sleep state. There was no seizure or seizure predisposition recorded on this study.   Ritta Slot, MD [facility] [phone]  If 7pm- 7am, please page neurology on call at [phone].

## 2013-05-13 NOTE — Consult Note (Signed)
CARDIOLOGY CONSULT NOTE  Patient ID: Judy Perez, MRN: [id number], DOB/AGE: 77-Oct-1931 77 y.o. Admit date: 05/12/2013   Date of Consult: 05/13/2013 Primary Physician: Pcp Not In System Primary Cardiologist: Was new to [facility] 5/26, saw Dr. Daleen Squibb   Chief Complaint: chest pain Reason for Consult: chest pain, h/o CAD  HPI: Judy Perez is an 79 y/o female with a h/o HTN, HL, DM, COPD, TIAs and CAD s/p stenting of an unknown vessel in [place] in 2004. She has not had cardiology f/u or testing since that time. She was recently seen in consult 3 days ago by our group with chest pain and negative enzymes - per notes, did not wish to pursue stress testing or cath. A f/u OP appointment was scheduled, but in the interim she returned to the ER with chest pain. She told the ER she did not remember saying this. She lives @ [employer] where she is relatively sedentary, with activity limited by chronic L>R leg pain/neuropathy and macular degeneration. History was somewhat difficult to obtain, as it was evident in the beginning of the conversation she was having difficulty formulating answers. I asked her where she was and she could not immediately answer me. She tried to get out a "Ch-" word, then stated, "my tongue is thick. My tongue is thick." I asked her where she lived and she said "I know where I live, but it's like I can't get the words to come out of my mouth." In between questions she remained very still and quiet and took a long time to answer. After just a few minutes, she then went on to answer other questions lucidly, but at slow rate of speech. Nursing spoke with the son who confirmed this was unusual for her. Hospitalist team and rapid response were notified. After being able to provide a more lucid cardiac history, she then stated to [place] "What's wrong with me? I am having trouble thinking. I am very disturbed by this." She then became tearful. She was outside the time frame for code stroke to be called as she  was last known to be normal several hours ago (has been sleeping since).   From a cardiac standpoint, after asking several different questions I was able to obtain the following information: she reports chest pain from 5pm-11pm last night, constant, starting on the right side of her chest across her whole chest "like chains pulling." This is different than the pain she presented with before in that it was more severe and more sharp. There is a nursing note that the patient reported relief of pain when her bra was removed noting "maybe my bra was too tight" - but the patient does not recall saying this. She reported exertional symptoms to the admitting physician, but to me denied any aggravating factors including inspiration, palpation, movement or exertion. She reports associated clamminess but denies any associated SOB, nausea, vomiting. The pain went away "with whatever they gave me" - per H/P she received "MONA" in ED, but I cannot find documentation of this and nursing has not given her any pain medicine since she's been to the floor.   Similar to her, her BP was elevated in the ER overnight up to a peak of 201/77, improved after clonidine. CXR with mild chronic bronchitic markings, otherwise normal mediastinum and heart silhouette. Troponins are neg x 2 thus far. CBG was 124 and BP 157 systolic at time of mental status changes.  Past Medical History  Diagnosis Date  . CAD (  coronary artery disease)     a. 2004 Cath/PCI with stenting x 1 in [place].  . Hypertension   . Hyperlipemia   . History of tobacco abuse     a. 50 pack yr hx, quit 2004  . History of shingles     a. left leg with residual pain.  . Peripheral neuropathy   . Hard of hearing   . Fibrous breast lumps     "both sides; I did not have cancer" (05/13/2013)  . Family history of anesthesia complication     "took a long time to wake my mother up after one of her surgeries" (05/13/2013)  . Macular degeneration, wet     "both eyes"  (05/13/2013)  . Type II diabetes mellitus   . Arthritis     "hands; maybe feet" (05/13/2013)  . TIA (transient ischemic attack)       Most Recent Cardiac Studies: none   Surgical History:  Past Surgical History  Procedure Laterality Date  . Tonsillectomy    . Appendectomy    . Carpal tunnel release Bilateral   . Mastectomy Bilateral 1980's    "multiple fibrous cysts; never had cancer" (05/13/2013)20  . Cesarean section  1960; 1962  . Inner ear surgery Left 1962    "put a piece of steel and a tube & a vein from my ?left hand in" (05/13/2013)  . Cataract extraction w/ intraocular lens  implant, bilateral Bilateral 2000  . Breast biopsy    . Tubal ligation  1963  . Coronary angioplasty with stent placement  2004    "1" (05/13/2013)     Home Meds: Prior to Admission medications   Medication Sig Start Date End Date Taking? Authorizing Provider  acetaminophen (TYLENOL) 325 MG tablet Take 325 mg by mouth every 6 (six) hours as needed for pain.   Yes Historical Provider, MD  alendronate (FOSAMAX) 35 MG tablet Take 35 mg by mouth every 7 (seven) days. fridays   Yes Historical Provider, MD  aspirin 325 MG buffered tablet Take 325 mg by mouth daily.   Yes Historical Provider, MD  bimatoprost (LUMIGAN) 0.01 % SOLN Place 2 drops into both eyes daily.    Yes Historical Provider, MD  calcium-vitamin D (OSCAL WITH D) 500-200 MG-UNIT per tablet Take 1 tablet by mouth daily.   Yes Historical Provider, MD  cloNIDine (CATAPRES) 0.1 MG tablet Take 0.1 mg by mouth 2 (two) times daily.   Yes Historical Provider, MD  docusate sodium (COLACE) 100 MG capsule Take 100 mg by mouth 2 (two) times daily.   Yes Historical Provider, MD  gabapentin (NEURONTIN) 300 MG capsule Take 300 mg by mouth at bedtime.   Yes Historical Provider, MD  isosorbide mononitrate (IMDUR) 30 MG 24 hr tablet Take 1 tablet (30 mg total) by mouth daily. 05/11/13  Yes Zannie Cove, MD  loratadine (CLARITIN) 10 MG tablet Take 10 mg by  mouth daily.   Yes Historical Provider, MD  meloxicam (MOBIC) 15 MG tablet Take 15 mg by mouth daily.   Yes Historical Provider, MD  metFORMIN (GLUCOPHAGE) 1000 MG tablet Take 1,000 mg by mouth daily with breakfast.   Yes Historical Provider, MD  Multiple Vitamin (TAB-A-VITE PO) Take 1 tablet by mouth daily.   Yes Historical Provider, MD  Multiple Vitamins-Minerals (I-VITE PO) Take 1 tablet by mouth daily.   Yes Historical Provider, MD  nebivolol (BYSTOLIC) 5 MG tablet Take 5 mg by mouth daily.   Yes Historical Provider, MD  polyvinyl alcohol (  LIQUIFILM TEARS) 1.4 % ophthalmic solution Place 1 drop into both eyes 6 (six) times daily.   Yes Historical Provider, MD  senna (SENOKOT) 8.6 MG TABS Take 1 tablet by mouth daily.   Yes Historical Provider, MD  simvastatin (ZOCOR) 10 MG tablet Take 10 mg by mouth at bedtime.   Yes Historical Provider, MD    Inpatient Medications:  . aspirin  324 mg Oral Once  . aspirin  325 mg Oral Daily  . bimatoprost  2 drop Both Eyes Daily  . cloNIDine  0.1 mg Oral BID  . docusate sodium  100 mg Oral BID  . gabapentin  300 mg Oral QHS  . heparin  5,000 Units Subcutaneous Q8H  . isosorbide mononitrate  30 mg Oral Daily  . loratadine  10 mg Oral Daily  . meloxicam  15 mg Oral Daily  . nebivolol  5 mg Oral Daily  . polyvinyl alcohol  1 drop Both Eyes 6 X Daily  . senna  1 tablet Oral Daily  . simvastatin  10 mg Oral q1800  . sodium chloride  3 mL Intravenous Q12H   . sodium chloride      Allergies: No Known Allergies  History   Social History  . Marital Status: Widowed    Spouse Name: N/A    Number of Children: N/A  . Years of Education: N/A   Occupational History  . Not on file.   Social History Main Topics  . Smoking status: Former Smoker -- 1.00 packs/day for 50 years    Types: Cigarettes    Quit date: 05/12/2003  . Smokeless tobacco: Never Used  . Alcohol Use: Yes     Comment: 05/13/2013 "socially; very seldom anymore"  . Drug Use: No  .  Sexually Active: No   Other Topics Concern  . Not on file   Social History Narrative   Lives in [facility].  Does not routinely exercise.  Ambulates with a cane.     Family History  Problem Relation Age of Onset  . Stroke Father     deceased  . Other Mother     deceased - possibly renal failure     Review of Systems: General: negative for chills, fever Cardiovascular: negative for orthopnea, edema, palpitations, paroxysmal nocturnal dyspnea, shortness of breath or dyspnea on exertion Dermatological: negative for rash Respiratory: negative for cough Urologic: negative for hematuria Abdominal: negative for nausea, vomiting, diarrhea, bright red blood per rectum, melena, or hematemesis Neurologic: negative for visual changes, syncope, or dizziness. But + difficulty thinking this AM All other systems reviewed and are otherwise negative except as noted above.  Labs:  Recent Labs  05/11/13 0545 05/11/13 0926 05/13/13 0038  TROPONINI <0.30 <0.30 <0.30   Lab Results  Component Value Date   WBC 8.0 05/13/2013   HGB 12.3 05/13/2013   HCT 36.3 05/13/2013   MCV 91.7 05/13/2013   PLT 258 05/13/2013    Recent Labs Lab 05/12/13 2142  05/13/13 0615  NA 140  < > 139  K 4.6  < > 4.1  CL 102  < > 104  CO2 26  --  23  BUN 25*  < > 22  CREATININE 1.06  < > 0.98  CALCIUM 9.7  --  9.0  PROT 7.4  --   --   BILITOT 0.2*  --   --   ALKPHOS 80  --   --   ALT 12  --   --  AST 20  --   --   GLUCOSE 137*  < > 140*  < > = values in this interval not displayed.  Radiology/Studies:  Dg Chest 2 View 05/12/2013   *RADIOLOGY REPORT*  Clinical Data: Chest pain  CHEST - 2 VIEW  Comparison: Chest radiograph 05/10/2013  Findings: Normal mediastinum and heart silhouette.  Lungs are clear. Degenerative osteophytosis of the thoracic spine. Mild chronic bronchitic markings centrally.  IMPRESSION: No interval change.  Mild chronic bronchitic markings.   Original Report Authenticated  By: Genevive Bi, M.D.   EKG: NSR 74bpm slight ST upsloping V3 and TWI avL, unchanged from tracing 3 days ago (no prior to compare to)  Physical Exam: Blood pressure 157/77, pulse 70, temperature 97.9 F (36.6 C), temperature source Oral, resp. rate 18, weight 167 lb 1.7 oz (75.8 kg), SpO2 93.00%. General: Well developed, well nourished WF in no acute distress. Head: Normocephalic, atraumatic, sclera non-icteric, no xanthomas, nares are without discharge.  Neck: Negative for carotid bruits. JVD not elevated. Lungs: Clear bilaterally to auscultation without wheezes, rales, or rhonchi. Breathing is unlabored. Heart: RRR with S1 S2. No murmurs, rubs, or gallops appreciated. Abdomen: Soft, non-tender, non-distended with normoactive bowel sounds. No hepatomegaly. No rebound/guarding. No obvious abdominal masses. Msk:  Strength and tone appear normal for age. Extremities: No clubbing or cyanosis. No edema.  Distal pedal pulses are 2+ and equal bilaterally. Neuro: Initially only oriented to self, slow to speak and could not tell me where she was or where she lived. Later she was able to formulate this correctly including the date with faster rate of speech. Extremely minimal L smile droop otherwise no facial asymmetry. No focal deficit except hard of hearing at baseline and vision is poor at baseline. Moves all extremities spontaneously. Psych:  Became tearful after she began having difficulty thinking.   Assessment and Plan:   1. Chest pain, atypical and typical features but inconsistent history in setting of #2 2. Altered mental status with history of TIAs  3. CAD s/p prior stenting 2001 4. HTN, accelerated on admission 5. Diabetes mellitus 6. Hyperlipidemia 7. Macular degeneration  Hospitalist team notified of mental status and neurologic evaluation is pending. From a cardiac standpoint, there is no objective evidence of ischemia thus far. Would proceed with neurologic workup, continue  ASA, statin. Check fasting lipid panel and consider increasing to more potent statin. BB has been held by primary team for blood pressure maintenance in setting of possible neurologic event. We would consider outpatient stress testing if cardiac enzymes remain negative. Obtain 2D echocardiogram. Will follow with you.  Signed, Ronie Spies PA-C 05/13/2013, 8:20 AM Patient seen and examined and history reviewed. Agree with above findings and plan. Pleasant 79 yo WF recently seen for evaluation of chest pain. Symptoms are difficult to pin down. Further evaluation with stress test offered but patient declined. Today patient developed acute dysarthria and confusion. Ecg shows no acute change.With acute neurologic changes work up of chest pain is not indicated at this time. If chest pain recurs we can consider ischemic work up at a later date. Proceed with Neuro work up. We will update Echo.  Theron Arista [facility] 05/13/2013 11:46 AM

## 2013-05-13 NOTE — Progress Notes (Signed)
Utilization review completed.  P.J. Neysha Criado,RN,BSN Case Manager [phone]  

## 2013-05-13 NOTE — ED Provider Notes (Signed)
I have personally seen and examined the patient.  I have discussed the plan of care with the resident.  I have reviewed the documentation on PMH/FH/Soc. History.  I have reviewed the documentation of the resident and agree.  Doug Sou, MD 05/13/13 1110

## 2013-05-13 NOTE — H&P (Signed)
[facility] History and Physical  Judy Perez RUE:[id number] DOB: Apr 30, 1930 DOA: 05/12/2013  Referring physician: ED PCP: Pcp Not In System   Chief Complaint: Chest pain  HPI: Judy Perez is a 79 y.o. female with pmh of DM2 and CAD s/p stenting in 2004.  Who presents with recurrent chest pain that has come back after her recent admission for the same 2 days ago.  Pain was located in her right chest this time, resolved completely after receiving MONA in ED.  Crushing in quality, no associated nausea, fever, chills, headache, abdominal pain.  Is worse with exertion she states this time.  In ED EKG unremarkable, first troponin is negative, hospitalist has been asked to admit.  Cards wants to consult on her in the morning first before ordering stress test.  Review of Systems: 12 systems reviewed but otherwise negative.  Past Medical History  Diagnosis Date  . CAD (coronary artery disease)     a. 2004 Cath/PCI with stenting x 1 in [place].  . Diabetes mellitus without complication   . Hypertension   . Hyperlipemia   . Macular degeneration   . COPD (chronic obstructive pulmonary disease)   . History of tobacco abuse     a. 50 pack yr hx, quit 2004  . History of shingles     a. left leg with residual pain.  . Peripheral neuropathy   . Hard of hearing    History reviewed. No pertinent past surgical history. Social History:  reports that she quit smoking about 10 years ago. Her smoking use included Cigarettes. She has a 50 pack-year smoking history. She does not have any smokeless tobacco history on file. She reports that  drinks alcohol. She reports that she does not use illicit drugs.   No Known Allergies  Family History  Problem Relation Age of Onset  . Stroke Father     deceased  . Other Mother     deceased - possibly renal failure    Prior to Admission medications   Medication Sig Start Date End Date Taking? Authorizing Provider  acetaminophen (TYLENOL) 325  MG tablet Take 650 mg by mouth every 6 (six) hours as needed for pain.   Yes Historical Provider, MD  alendronate (FOSAMAX) 35 MG tablet Take 35 mg by mouth every 7 (seven) days. fridays   Yes Historical Provider, MD  aspirin 325 MG buffered tablet Take 325 mg by mouth daily.   Yes Historical Provider, MD  bimatoprost (LUMIGAN) 0.01 % SOLN Place 2 drops into both eyes daily.   Yes Historical Provider, MD  calcium-vitamin D (OSCAL WITH D) 500-200 MG-UNIT per tablet Take 1 tablet by mouth daily.   Yes Historical Provider, MD  cloNIDine (CATAPRES) 0.1 MG tablet Take 0.1 mg by mouth daily.   Yes Historical Provider, MD  docusate sodium (COLACE) 100 MG capsule Take 100 mg by mouth 2 (two) times daily.   Yes Historical Provider, MD  erythromycin ophthalmic ointment Place 1 application into the right eye every 2 (two) hours as needed (for pain).   Yes Historical Provider, MD  gabapentin (NEURONTIN) 300 MG capsule Take 300 mg by mouth at bedtime.   Yes Historical Provider, MD  guaiFENesin (MUCINEX) 600 MG 12 hr tablet Take 600 mg by mouth 2 (two) times daily as needed for congestion.   Yes Historical Provider, MD  loratadine (CLARITIN) 10 MG tablet Take 10 mg by mouth daily.   Yes Historical Provider, MD  meloxicam (MOBIC) 15 MG tablet Take 15 mg  by mouth daily.   Yes Historical Provider, MD  metFORMIN (GLUCOPHAGE) 1000 MG tablet Take 1,000 mg by mouth daily with breakfast.   Yes Historical Provider, MD  Multiple Vitamin (TAB-A-VITE PO) Take 1 tablet by mouth daily.   Yes Historical Provider, MD  Multiple Vitamins-Minerals (I-VITE PO) Take 1 tablet by mouth daily.   Yes Historical Provider, MD  nebivolol (BYSTOLIC) 5 MG tablet Take 5 mg by mouth daily.   Yes Historical Provider, MD  polyethylene glycol (MIRALAX / GLYCOLAX) packet Take 17 g by mouth daily as needed (constipation).   Yes Historical Provider, MD  polyvinyl alcohol (LIQUIFILM TEARS) 1.4 % ophthalmic solution Place 1 drop into both eyes 6 (six)  times daily.   Yes Historical Provider, MD  senna (SENOKOT) 8.6 MG TABS Take 1 tablet by mouth daily.   Yes Historical Provider, MD  simvastatin (ZOCOR) 10 MG tablet Take 10 mg by mouth at bedtime.   Yes Historical Provider, MD   Physical Exam: Filed Vitals:   05/12/13 2015 05/12/13 2145 05/12/13 2228  BP: 182/63 189/71 171/69  Pulse: 72 72 71  Temp: 97.7 F (36.5 C)    TempSrc: Axillary    Resp: 15 21 17   SpO2: 96% 95% 95%    General:  NAD, resting comfortably in bed Eyes: PEERLA EOMI ENT: mucous membranes moist, patient very hard of hearing Neck: supple w/o JVD Cardiovascular: RRR w/o MRG Respiratory: CTA B Abdomen: soft, nt, nd, bs+ Skin: no rash nor lesion Musculoskeletal: MAE, full ROM all 4 extremities Psychiatric: normal tone and affect Neurologic: AAOx3, grossly non-focal  Labs on Admission:  Basic Metabolic Panel:  Recent Labs Lab 05/10/13 2152 05/11/13 0545 05/12/13 2142 05/12/13 2155  NA 141 139 140 141  K 4.3 4.1 4.6 4.4  CL 102 103 102 106  CO2 30 24 26   --   GLUCOSE 135* 115* 137* 137*  BUN 21 18 25* 27*  CREATININE 1.01 0.93 1.06 1.10  CALCIUM 9.6 9.5 9.7  --    Liver Function Tests:  Recent Labs Lab 05/12/13 2142  AST 20  ALT 12  ALKPHOS 80  BILITOT 0.2*  PROT 7.4  ALBUMIN 3.9   No results found for this basename: LIPASE, AMYLASE,  in the last 168 hours No results found for this basename: AMMONIA,  in the last 168 hours CBC:  Recent Labs Lab 05/10/13 2152 05/11/13 0545 05/12/13 2142 05/12/13 2155  WBC 7.2 7.0 8.9  --   NEUTROABS 3.0  --   --   --   HGB 12.5 12.6 13.1 13.3  HCT 37.0 37.0 37.7 39.0  MCV 92.3 92.0 90.6  --   PLT 263 260 281  --    Cardiac Enzymes:  Recent Labs Lab 05/11/13 0545 05/11/13 0926  TROPONINI <0.30 <0.30    BNP (last 3 results) No results found for this basename: PROBNP,  in the last 8760 hours CBG:  Recent Labs Lab 05/11/13 0450 05/11/13 0801 05/11/13 1151  GLUCAP 127* 113* 133*     Radiological Exams on Admission: Dg Chest 2 View  05/12/2013   *RADIOLOGY REPORT*  Clinical Data: Chest pain  CHEST - 2 VIEW  Comparison: Chest radiograph 05/10/2013  Findings: Normal mediastinum and heart silhouette.  Lungs are clear. Degenerative osteophytosis of the thoracic spine. Mild chronic bronchitic markings centrally.  IMPRESSION: No interval change.  Mild chronic bronchitic markings.   Original Report Authenticated By: Genevive Bi, M.D.    EKG: Independently reviewed.  Assessment/Plan Principal Problem:  Chest pain Active Problems:   DM2 (diabetes mellitus, type 2)   CAD (coronary artery disease)   1. Chest pain - readmitting patient, needs inpatient stress test which patient now states she is agreeable to do, cards to see patient in AM and order this (stress test had been recommended during last admit as well, see Dr. Vern Claude note).  Additional information this time includes fact that symptoms are ongoing, worse with exertion better at rest.  Am highly suspicious that this is recurrent CAD in this patient. 2. DM2 - putting patient on SSI and holding home metformin 3. CAD - last cath and stenting was in 2004, last stress test was "a couple of days before the cath"    Code Status: Full Code (must indicate code status--if unknown or must be presumed, indicate so) Family Communication: No family at bedside (indicate person spoken with, if applicable, with phone number if by telephone) Disposition Plan: Admit to inpatient (indicate anticipated LOS)  Time spent: 70 min  GARDNER, JARED M. [facility] Pager [phone]  If 7PM-7AM, please contact night-coverage [email or web address] Password [facility] 05/13/2013, 12:22 AM

## 2013-05-13 NOTE — Progress Notes (Signed)
EEG completed.

## 2013-05-13 NOTE — Progress Notes (Signed)
  Echocardiogram 2D Echocardiogram has been performed.  Judy Perez 05/13/2013, 2:08 PM

## 2013-05-14 LAB — LIPID PANEL
Cholesterol: 186 mg/dL (ref 0–200)
HDL: 58 mg/dL (ref >39)
Total CHOL/HDL Ratio: 3.2 RATIO
Triglycerides: 112 mg/dL (ref <150)

## 2013-05-14 LAB — GLUCOSE, CAPILLARY: Glucose-Capillary: 189 mg/dL — ABNORMAL HIGH (ref 70–99)

## 2013-05-14 MED ORDER — CLOPIDOGREL BISULFATE 75 MG PO TABS: 75.0000 mg | ORAL_TABLET | Freq: Every day | ORAL | Status: DC

## 2013-05-14 MED ORDER — CLOPIDOGREL BISULFATE 75 MG PO TABS
75.0000 mg | ORAL_TABLET | Freq: Every day | ORAL | Status: DC
  Administered 2013-05-14: 75 mg via ORAL
  Filled 2013-05-14: qty 1

## 2013-05-14 NOTE — Progress Notes (Signed)
Clinical Social Work Department BRIEF PSYCHOSOCIAL ASSESSMENT 05/14/2013  Patient:  Judy Perez, Judy Perez     Account Number:  [id number]     Admit date:  05/12/2013  Clinical Social Worker:  Kirke Shaggy  Date/Time:  05/14/2013 01:00 PM  Referred by:  CSW  Date Referred:  05/14/2013 Referred for  Other - See comment   Other Referral:   Pt lives at an ALF [facility] and she needs to return to ALF.   Interview type:  Patient Other interview type:    PSYCHOSOCIAL DATA Living Status:  FACILITY Admitted from facility:  [facility] [address] Level of care:  Assisted Living Primary support name:  Judy Perez Primary support relationship to patient:  CHILD, ADULT Degree of support available:   Pretty good- pt stated that her son and his family live in the area.    CURRENT CONCERNS Current Concerns  None Noted   Other Concerns:    SOCIAL WORK ASSESSMENT / PLAN CSW met with the pt at bedside to discuss going back to her ALF.  Pt was lucid, was speaking clearly and would love to be d/c back to her apartment as soon as possible.   Assessment/plan status:  No Further Intervention Required Other assessment/ plan:   Information/referral to community resources:    PATIENT'S/FAMILY'S RESPONSE TO PLAN OF CARE: Pt wants to go back home as soon as possible.    Sherald Barge, LCSW-A Clinical Social Worker [phone]

## 2013-05-14 NOTE — Discharge Summary (Signed)
Physician Discharge Summary  Tamasha Laplante QIO:[id number] DOB: January 16, 1930 DOA: 05/12/2013  PCP: Pcp Not In System  Admit date: 05/12/2013 Discharge date: 05/14/2013  Time spent: 40 minutes  Recommendations for Outpatient Follow-up:  1. Patient is to continue Plavix indefinitely-she probably had a TIA in the hospital 2. Cardiology will see patient to determine next best step in one to 2 weeks. Dr. Swaziland will be making an appointment for followup for her 3. Patient should continue to see her ophthalmologist for macular degeneration. 4. Consider getting ENT referral for very poor hearing 5. Would consider once yearly Zomig over alendronate as this might be what is causing her chest pain 6. Recommend outpatient MMSE   Discharge Diagnoses:  Principal Problem:   Chest pain Active Problems:   DM2 (diabetes mellitus, type 2)   CAD (coronary artery disease)   TIA (transient ischemic attack)   Discharge Condition: Fair  Diet recommendation: Healthy low-salt  Filed Weights   05/13/13 0200  Weight: 75.8 kg (167 lb 1.7 oz)    History of present illness:  This 79 year old Caucasian female recently Transplanted from [facility] [place] to the hospital for the second time 5/29 2014 with chest pain-she has a history of diabetes mellitus type 2 and CAD status post stent in 2004. Her pain was in the right chest and relieved completely with nitroglycerin and the emergency room and it was crushing in nature. She had come in to the hospital 05/11/2013 and had been ruled out for coronary artery disease at that time I cardiac enzymes and had allegedly refused cardiac workup which she now completely denies and does not remember In the hospital on 5/20 9 AM she could not move felt confused and had some dysnomia as well as a thick tongue which apparently is unusual for her she had no weakness paresthesia and since she was found this way after waking up it was not known when the exact time of  this occurred She had a stroke workup including a CT angiogram of the head and she cannot get an MRI, and neurology was consulted and recommended placing her on Plavix 75 mg daily. Her LDL was 106 and  hemoglobin A1c was 6.7 Echocardiogram performed showed inadequate LV wall motion assessment with very limited exam EF was 55% Patient was not a candidate for invasive determination of coronary anatomy given recent TIA symptomatology per Dr. Swaziland of cardiology, who recommends her following up in the outpatient setting in 2-3 weeks and discussion to the neck or regarding further plan Would recommend as an outpatient changing to Crestor 10 mg daily and have a discussion with family regarding further diabetes control although her A1c was pretty well controlled The rest of her hospital stay was pretty unremarkable  Consultations:  Neurology  Cardiology  Discharge Exam: Filed Vitals:   05/14/13 0200 05/14/13 0400 05/14/13 0500 05/14/13 0800  BP: 184/56 171/49 154/70 134/64  Pulse: 85 80  67  Temp:    98.1 F (36.7 C)  TempSrc:    Oral  Resp: 18 18  17   Weight:      SpO2: 96% 95%  94%   Alert oriented very hard of hearing  General: Pleasant, no distress no pallor no icterus Cardiovascular: S1-S2 no murmur rub or gallop Respiratory: Clinically clear  Discharge Instructions  Discharge Orders   Future Appointments Provider Department Dept Phone   05/27/2013 11:10 AM Beatrice Lecher, PA-C [facility] Main Office [place]) [phone]   Future Orders Complete By Expires  Diet - low sodium heart healthy  As directed     Discharge instructions  As directed     Comments:      Follow with cardiology in 3-4 weeks.  They will call you Continue Plavix indefinitely    Increase activity slowly  As directed         Medication List    STOP taking these medications       meloxicam 15 MG tablet  Commonly known as:  MOBIC      TAKE these medications       acetaminophen 325 MG  tablet  Commonly known as:  TYLENOL  Take 325 mg by mouth every 6 (six) hours as needed for pain.     alendronate 35 MG tablet  Commonly known as:  FOSAMAX  Take 35 mg by mouth every 7 (seven) days. fridays     aspirin 325 MG buffered tablet  Take 325 mg by mouth daily.     bimatoprost 0.01 % Soln  Commonly known as:  LUMIGAN  Place 2 drops into both eyes daily.     calcium-vitamin D 500-200 MG-UNIT per tablet  Commonly known as:  OSCAL WITH D  Take 1 tablet by mouth daily.     cloNIDine 0.1 MG tablet  Commonly known as:  CATAPRES  Take 0.1 mg by mouth 2 (two) times daily.     clopidogrel 75 MG tablet  Commonly known as:  PLAVIX  Take 1 tablet (75 mg total) by mouth daily with breakfast.     docusate sodium 100 MG capsule  Commonly known as:  COLACE  Take 100 mg by mouth 2 (two) times daily.     gabapentin 300 MG capsule  Commonly known as:  NEURONTIN  Take 300 mg by mouth at bedtime.     I-VITE PO  Take 1 tablet by mouth daily.     isosorbide mononitrate 30 MG 24 hr tablet  Commonly known as:  IMDUR  Take 1 tablet (30 mg total) by mouth daily.     loratadine 10 MG tablet  Commonly known as:  CLARITIN  Take 10 mg by mouth daily.     metFORMIN 1000 MG tablet  Commonly known as:  GLUCOPHAGE  Take 1,000 mg by mouth daily with breakfast.     nebivolol 5 MG tablet  Commonly known as:  BYSTOLIC  Take 5 mg by mouth daily.     polyvinyl alcohol 1.4 % ophthalmic solution  Commonly known as:  LIQUIFILM TEARS  Place 1 drop into both eyes 6 (six) times daily.     senna 8.6 MG Tabs  Commonly known as:  SENOKOT  Take 1 tablet by mouth daily.     simvastatin 10 MG tablet  Commonly known as:  ZOCOR  Take 10 mg by mouth at bedtime.     TAB-A-VITE PO  Take 1 tablet by mouth daily.       No Known Allergies    The results of significant diagnostics from this hospitalization (including imaging, microbiology, ancillary and laboratory) are listed below for  reference.    Significant Diagnostic Studies: Ct Angio Head W/cm &/or Wo Cm  05/13/2013   *RADIOLOGY REPORT*  Clinical Data:  Stroke  CT ANGIOGRAPHY HEAD AND NECK  Technique:  Multidetector CT imaging of the head and neck was performed using the standard protocol during bolus administration of intravenous contrast.  Multiplanar CT image reconstructions including MIPs were obtained to evaluate the vascular anatomy. Carotid stenosis measurements (when  applicable) are obtained utilizing NASCET criteria, using the distal internal carotid diameter as the denominator.  Contrast: 50mL OMNIPAQUE IOHEXOL 350 MG/ML SOLN  Comparison:   None.  CTA NECK  Findings:  Negative for mass or adenopathy.  Advanced cervical spondylosis is present with extensive disc degeneration and spurring C3-C7.  3 mm anterior slip C3-4.  No acute bony change.  Atherosclerotic aortic arch.  Proximal great vessels are patent with atherosclerotic disease but no significant stenosis.  Right carotid:  The right common carotid artery is widely patent. Kissing carotids with medial deviation of the common carotid in the retropharyngeal space.  Atherosclerotic calcification in the right carotid bifurcation.  There is a 40% diameter stenosis of the proximal right internal carotid artery.  There is moderately severe stenosis of the origin of the right external carotid artery.  Left carotid:  The left common carotid artery is widely patent. Mild atherosclerotic calcification of carotid bifurcation without significant stenosis of the internal or external carotid artery on the left.  Vertebral arteries:  Both vertebral arteries are widely patent to the basilar without significant stenosis.  Vertebral bodies are approximately the same size.  Negative for carotid or vertebral artery dissection.   Review of the MIP images confirms the above findings.  IMPRESSION: Advanced cervical spondylosis  40% diameter stenosis right internal carotid artery.  Moderate to  severe stenosis proximal right external carotid artery.  Mild calcification of the left carotid bifurcation without significant carotid stenosis on the left.  No significant vertebral artery stenosis.  CTA HEAD  Findings:  Generalized atrophy is present.  Chronic infarct in the right internal capsule and periventricular white matter.  Chronic infarct right anterior thalamus.  Chronic microvascular ischemic changes throughout the white matter.  No acute infarct.  Negative for hemorrhage or mass.  No enhancing lesion is present following contrast infusion.  Both vertebral arteries are patent to the basilar.  PICA is patent bilaterally.  Basilar is widely patent.  AICA is patent bilaterally.  Superior cerebellar and posterior cerebral arteries are patent.  Fetal origin of the left posterior cerebral artery with hypoplastic left P1 segment.  Atherosclerotic calcification in the cavernous carotid bilaterally with mild stenosis bilaterally.  Middle cerebral arteries are widely patent bilaterally.  Mild disease in the middle cerebral artery branches.  Both anterior cerebral arteries are patent. There is atherosclerotic irregularity the   pericallosal branches of the anterior cerebral artery bilaterally.  Negative for cerebral aneurysm.   Review of the MIP images confirms the above findings.  IMPRESSION: Intracranial atherosclerotic disease, mild in degree.  No large vessel occlusion.   Original Report Authenticated By: Janeece Riggers, M.D.   Dg Chest 2 View  05/12/2013   *RADIOLOGY REPORT*  Clinical Data: Chest pain  CHEST - 2 VIEW  Comparison: Chest radiograph 05/10/2013  Findings: Normal mediastinum and heart silhouette.  Lungs are clear. Degenerative osteophytosis of the thoracic spine. Mild chronic bronchitic markings centrally.  IMPRESSION: No interval change.  Mild chronic bronchitic markings.   Original Report Authenticated By: Genevive Bi, M.D.   Dg Chest 2 View  05/10/2013   *RADIOLOGY REPORT*  Clinical  Data: Chest pain, cough and mild shortness of breath.  CHEST - 2 VIEW  Comparison: None.  Findings: The lungs are well-aerated.  Minimal bibasilar opacities may reflect overlying soft tissues, though mild pneumonia might have a similar appearance.  There is no evidence of pleural effusion or pneumothorax.  The heart is normal in size; the mediastinal contour is within normal limits.  No acute osseous abnormalities are seen.  IMPRESSION: Minimal bibasilar opacities may reflect overlying soft tissues, though mild pneumonia might have a similar appearance.   Original Report Authenticated By: Tonia Ghent, M.D.   Ct Angio Neck W/cm &/or Wo/cm  05/13/2013   *RADIOLOGY REPORT*  Clinical Data:  Stroke  CT ANGIOGRAPHY HEAD AND NECK  Technique:  Multidetector CT imaging of the head and neck was performed using the standard protocol during bolus administration of intravenous contrast.  Multiplanar CT image reconstructions including MIPs were obtained to evaluate the vascular anatomy. Carotid stenosis measurements (when applicable) are obtained utilizing NASCET criteria, using the distal internal carotid diameter as the denominator.  Contrast: 50mL OMNIPAQUE IOHEXOL 350 MG/ML SOLN  Comparison:   None.  CTA NECK  Findings:  Negative for mass or adenopathy.  Advanced cervical spondylosis is present with extensive disc degeneration and spurring C3-C7.  3 mm anterior slip C3-4.  No acute bony change.  Atherosclerotic aortic arch.  Proximal great vessels are patent with atherosclerotic disease but no significant stenosis.  Right carotid:  The right common carotid artery is widely patent. Kissing carotids with medial deviation of the common carotid in the retropharyngeal space.  Atherosclerotic calcification in the right carotid bifurcation.  There is a 40% diameter stenosis of the proximal right internal carotid artery.  There is moderately severe stenosis of the origin of the right external carotid artery.  Left carotid:  The  left common carotid artery is widely patent. Mild atherosclerotic calcification of carotid bifurcation without significant stenosis of the internal or external carotid artery on the left.  Vertebral arteries:  Both vertebral arteries are widely patent to the basilar without significant stenosis.  Vertebral bodies are approximately the same size.  Negative for carotid or vertebral artery dissection.   Review of the MIP images confirms the above findings.  IMPRESSION: Advanced cervical spondylosis  40% diameter stenosis right internal carotid artery.  Moderate to severe stenosis proximal right external carotid artery.  Mild calcification of the left carotid bifurcation without significant carotid stenosis on the left.  No significant vertebral artery stenosis.  CTA HEAD  Findings:  Generalized atrophy is present.  Chronic infarct in the right internal capsule and periventricular white matter.  Chronic infarct right anterior thalamus.  Chronic microvascular ischemic changes throughout the white matter.  No acute infarct.  Negative for hemorrhage or mass.  No enhancing lesion is present following contrast infusion.  Both vertebral arteries are patent to the basilar.  PICA is patent bilaterally.  Basilar is widely patent.  AICA is patent bilaterally.  Superior cerebellar and posterior cerebral arteries are patent.  Fetal origin of the left posterior cerebral artery with hypoplastic left P1 segment.  Atherosclerotic calcification in the cavernous carotid bilaterally with mild stenosis bilaterally.  Middle cerebral arteries are widely patent bilaterally.  Mild disease in the middle cerebral artery branches.  Both anterior cerebral arteries are patent. There is atherosclerotic irregularity the   pericallosal branches of the anterior cerebral artery bilaterally.  Negative for cerebral aneurysm.   Review of the MIP images confirms the above findings.  IMPRESSION: Intracranial atherosclerotic disease, mild in degree.  No large  vessel occlusion.   Original Report Authenticated By: Janeece Riggers, M.D.    Microbiology: No results found for this or any previous visit (from the past 240 hour(s)).   Labs: Basic Metabolic Panel:  Recent Labs Lab 05/10/13 2152 05/11/13 0545 05/12/13 2142 05/12/13 2155 05/13/13 0615  NA 141 139 140 141 139  K  4.3 4.1 4.6 4.4 4.1  CL 102 103 102 106 104  CO2 30 24 26   --  23  GLUCOSE 135* 115* 137* 137* 140*  BUN 21 18 25* 27* 22  CREATININE 1.01 0.93 1.06 1.10 0.98  CALCIUM 9.6 9.5 9.7  --  9.0   Liver Function Tests:  Recent Labs Lab 05/12/13 2142  AST 20  ALT 12  ALKPHOS 80  BILITOT 0.2*  PROT 7.4  ALBUMIN 3.9   No results found for this basename: LIPASE, AMYLASE,  in the last 168 hours No results found for this basename: AMMONIA,  in the last 168 hours CBC:  Recent Labs Lab 05/10/13 2152 05/11/13 0545 05/12/13 2142 05/12/13 2155 05/13/13 0615  WBC 7.2 7.0 8.9  --  8.0  NEUTROABS 3.0  --   --   --   --   HGB 12.5 12.6 13.1 13.3 12.3  HCT 37.0 37.0 37.7 39.0 36.3  MCV 92.3 92.0 90.6  --  91.7  PLT 263 260 281  --  258   Cardiac Enzymes:  Recent Labs Lab 05/11/13 0545 05/11/13 0926 05/13/13 0038 05/13/13 0810 05/13/13 1145  TROPONINI <0.30 <0.30 <0.30 <0.30 <0.30   BNP: BNP (last 3 results) No results found for this basename: PROBNP,  in the last 8760 hours CBG:  Recent Labs Lab 05/13/13 1144 05/13/13 1628 05/13/13 2059 05/14/13 0839 05/14/13 1213  GLUCAP 132* 200* 144* 189* 151*       Signed:  Rhetta Mura  [facility] 05/14/2013, 1:41 PM

## 2013-05-14 NOTE — Progress Notes (Addendum)
Clinical Social Work Department CLINICAL SOCIAL WORK PLACEMENT NOTE 05/14/2013  Patient:  Judy Perez, Judy Perez  Account Number:  [id number] Admit date:  05/12/2013  Clinical Social Worker:  Kirke Shaggy  Date/time:  05/14/2013 01:05 PM  Clinical Social Work is seeking post-discharge placement for this patient at the following level of care:   ASSISTED LIVING/REST HOME   (*CSW will update this form in Epic as items are completed)   05/14/2013  Patient/family provided with [facility] Department of Clinical Social Work's list of facilities offering this level of care within the geographic area requested by the patient (or if unable, by the patient's family).  05/14/2013  Patient/family informed of their freedom to choose among providers that offer the needed level of care, that participate in Medicare, Medicaid or managed care program needed by the patient, have an available bed and are willing to accept the patient.  05/14/2013  Patient/family informed of [facility]' ownership interest in [facility], as well as of the fact that they are under no obligation to receive care at this facility.  PASARR submitted to [employer] on  PASARR number received from [employer] on   FL2 transmitted to all facilities in geographic area requested by pt/family on  05/14/2013 FL2 transmitted to all facilities within larger geographic area on 05/14/2013  Patient informed that his/her managed care company has contracts with or will negotiate with  certain facilities, including the following:     Patient/family informed of bed offers received:  05/14/2013 Patient chooses bed at [facility] [address] [facility] Physician recommends and patient chooses bed at    Patient to be transferred to [facility] [address] [place] on  05/14/2013 Patient to be transferred to facility by [facility] ambulance service  Sherald Barge, LCSW-A Clinical Social Worker [phone]

## 2013-05-14 NOTE — Progress Notes (Signed)
CSW received referral to do the paperwork for the return to her ALF [place]. CSW met with the pt this morning. Pt has an apartment at [facility]. CSW called the ALF and spoke with [facility]. CSW will fax FL2 and MD progress notes to ALF to make sure that she is appropriate to return home.   Sherald Barge, LCSW-A Clinical Social Worker [phone]

## 2013-05-14 NOTE — Progress Notes (Signed)
Stroke Team Progress Note  HISTORY Judy Perez is an 79 y.o. female who was admitted for CP. Last night she was feeling normal. She awoke this am and noted inability to get her thoughts out. She did not move thus cannot tell us if she was weak of one side or another. She also felt confused and scared. She waited until the nurse entered the room and then explained to her she felt as though she was not speaking right. "She tried to get out a "Ch-" word, then stated, "my tongue is thick. My tongue is thick." I asked her where she lived and she said "I know where I live, but it's like I can't get the words to come out of my mouth." She then went on to answer other questions lucidly, but at slow rate of speech. Nursing spoke with the son who confirmed this was unusual for her." Currently she has resolved but is still very anxious. Again, no weakness or paresthesia was noted by the patient, no blurred vision. Admitted on aspirin 325mg  daily.  Date last known well: 5.28.14  Time last known well: evening of 5.28.14 (unknown exact time)  tPA Given: No: symptoms resolved  SUBJECTIVE  Patient feels like she has improved to normal and wants to go home. Denies any history of seizures.    OBJECTIVE Most recent Vital Signs: Filed Vitals:   05/14/13 0000 05/14/13 0200 05/14/13 0400 05/14/13 0500  BP: 177/48 184/56 171/49 154/70  Pulse: 81 85 80   Temp:      TempSrc:      Resp: 18 18 18    Weight:      SpO2: 95% 96% 95%    CBG (last 3)   Recent Labs  05/13/13 1144 05/13/13 1628 05/13/13 2059  GLUCAP 132* 200* 144*    IV Fluid Intake:   . sodium chloride      MEDICATIONS  . aspirin  324 mg Oral Once  . aspirin  325 mg Oral Daily  . bimatoprost  2 drop Both Eyes Daily  . cloNIDine  0.1 mg Oral BID  . docusate sodium  100 mg Oral BID  . gabapentin  300 mg Oral QHS  . heparin  5,000 Units Subcutaneous Q8H  . insulin aspart  0-9 Units Subcutaneous TID WC  . loratadine  10 mg Oral Daily   . meloxicam  15 mg Oral Daily  . polyvinyl alcohol  1 drop Both Eyes 6 X Daily  . senna  1 tablet Oral Daily  . simvastatin  10 mg Oral q1800  . sodium chloride  3 mL Intravenous Q12H   PRN:  acetaminophen, nitroGLYCERIN  Diet:  Carb Control thin liquids Activity:  Bedrest DVT Prophylaxis:  Heparin 5000units q 8 hours  CLINICALLY SIGNIFICANT STUDIES Basic Metabolic Panel:  Recent Labs Lab 05/12/13 2142 05/12/13 2155 05/13/13 0615  NA 140 141 139  K 4.6 4.4 4.1  CL 102 106 104  CO2 26  --  23  GLUCOSE 137* 137* 140*  BUN 25* 27* 22  CREATININE 1.06 1.10 0.98  CALCIUM 9.7  --  9.0   Liver Function Tests:  Recent Labs Lab 05/12/13 2142  AST 20  ALT 12  ALKPHOS 80  BILITOT 0.2*  PROT 7.4  ALBUMIN 3.9   CBC:  Recent Labs Lab 05/10/13 2152  05/12/13 2142 05/12/13 2155 05/13/13 0615  WBC 7.2  < > 8.9  --  8.0  NEUTROABS 3.0  --   --   --   --  HGB 12.5  < > 13.1 13.3 12.3  HCT 37.0  < > 37.7 39.0 36.3  MCV 92.3  < > 90.6  --  91.7  PLT 263  < > 281  --  258  < > = values in this interval not displayed. Coagulation: No results found for this basename: LABPROT, INR,  in the last 168 hours Cardiac Enzymes:  Recent Labs Lab 05/13/13 0038 05/13/13 0810 05/13/13 1145  TROPONINI <0.30 <0.30 <0.30   Urinalysis: No results found for this basename: COLORURINE, APPERANCEUR, LABSPEC, PHURINE, GLUCOSEU, HGBUR, BILIRUBINUR, KETONESUR, PROTEINUR, UROBILINOGEN, NITRITE, LEUKOCYTESUR,  in the last 168 hours Lipid Panel    Component Value Date/Time   CHOL 186 05/14/2013 0545   TRIG 112 05/14/2013 0545   HDL 58 05/14/2013 0545   CHOLHDL 3.2 05/14/2013 0545   VLDL 22 05/14/2013 0545   LDLCALC 106* 05/14/2013 0545   HgbA1C  Lab Results  Component Value Date   HGBA1C 6.7* 05/13/2013    Urine Drug Screen:   No results found for this basename: labopia, cocainscrnur, labbenz, amphetmu, thcu, labbarb    Alcohol Level: No results found for this basename: ETH,  in the last  168 hours  Ct Angio Head W/cm &/or Wo Cm 05/13/2013   CTA NECK: Advanced cervical spondylosis  40% diameter stenosis right internal carotid artery.  Moderate to severe stenosis proximal right external carotid artery.  Mild calcification of the left carotid bifurcation without significant carotid stenosis on the left.  No significant vertebral artery stenosis.  CTA HEAD  Intracranial atherosclerotic disease, mild in degree.  No large vessel occlusion.  Dg Chest 2 View 05/12/2013    Comparison: Chest radiograph 05/10/2013  Findings: Normal mediastinum and heart silhouette.  Lungs are clear. Degenerative osteophytosis of the thoracic spine. Mild chronic bronchitic markings centrally.  IMPRESSION: No interval change.  Mild chronic bronchitic markings.      Ct Angio Neck W/cm &/or Wo/cm 05/13/2013     CTA NECK  Advanced cervical spondylosis  40% diameter stenosis right internal carotid artery.  Moderate to severe stenosis proximal right external carotid artery.  Mild calcification of the left carotid bifurcation without significant carotid stenosis on the left.  No significant vertebral artery stenosis.  CTA HEAD   Intracranial atherosclerotic disease, mild in degree.  No large vessel occlusion.Marland Kitchen   MRI of the brain    MRA of the brain    2D Echocardiogram  EF 55% Limited study with poor visualization,\  Carotid Doppler  See angio results  CXR  Mild chronic bronchitic markings.  EKG  normal sinus rhythm.   EEGThis normal EEG is recorded in the waking and sleep state. There was no seizure or seizure predisposition recorded on this study  Therapy Recommendations   Physical Exam   Mental Status:  Alert, oriented, thought content appropriate. Speech fluent without evidence of aphasia. Able to follow 3 step commands without difficulty. Recall 2/3. Animal naming  10.Marland Kitchen No paraphasic errors Cranial Nerves:  II: Discs flat bilaterally; Visual fields grossly normal, pupils equal, round, reactive to  light and accommodation  III,IV, VI: ptosis not present, extra-ocular motions intact bilaterally  V,VII: smile symmetric, facial light touch sensation normal bilaterally  VIII: hearing decreased on the right  IX,X: gag reflex present  XI: bilateral shoulder shrug  XII: midline tongue extension  Motor:  Right : Upper extremity 5/5 Left: Upper extremity 5/5  Lower extremity 5/5 Lower extremity 5/5  Tone and bulk:normal tone throughout; no atrophy noted  Sensory:  Pinprick and light touch intact throughout, bilaterally but right arm is less sensative  Deep Tendon Reflexes: 2+ and symmetric throughout UE, 1+ KJ and no AJ  Plantars:  Right: downgoing Left: downgoing  Cerebellar:  normal finger-to-nose, normal heel-to-shin test  CV: pulses palpable throughout    ASSESSMENT [name] is a 79 y.o. female presenting with acute delirium, expressive aphagia. . Imaging confirms no acute  infarct. Episode felt to be transient ischemic attack.  On aspirin 325 mg orally every day prior to admission. Now on aspirin 325 mg orally every day for secondary stroke prevention. Patient with resultant expressive aphagia, acute delirium. EEG shows no seizure activity.   Hyperlipidemia, LDL 106, goal < 70 in diabetic patients  Coronary artery disease   Hypertension  Diabetes Mellitus, type 2, 6.7  Transient ischemic attack.  Hospital day # 2  TREATMENT/PLAN  Transition to Plavix 75mg  daily for secondary stroke prevention.  Risk factor modification  Patient on lipid lowering agents PTA. Would recommend increasing zocor or switching to lipitor if she can tolerate statins. Otherwise could use crestor. LFTs are normal  Stroke service will sign off. Patient may follow up with Dr. Pearlean Brownie in 2 months. Recommend follow up with primary MD within the next 2 weeks.  Gwendolyn Lima. Manson Passey, [facility], MBA, MHA [facility] Pager: [phone] 05/14/2013 7:40 AM  I have personally obtained a history,  examined the patient, evaluated imaging results, and formulated the assessment and plan of care. I agree with the above. Delia Heady, MD

## 2013-05-27 ENCOUNTER — Ambulatory Visit (INDEPENDENT_AMBULATORY_CARE_PROVIDER_SITE_OTHER): Payer: Medicare PPO | Admitting: Physician Assistant

## 2013-05-27 ENCOUNTER — Encounter: Payer: Self-pay | Admitting: Physician Assistant

## 2013-05-27 VITALS — BP 142/80 | HR 71 | Ht 61.0 in | Wt 168.8 lb

## 2013-05-27 DIAGNOSIS — G459 Transient cerebral ischemic attack, unspecified: Secondary | ICD-10-CM

## 2013-05-27 DIAGNOSIS — R079 Chest pain, unspecified: Secondary | ICD-10-CM

## 2013-05-27 DIAGNOSIS — I2510 Atherosclerotic heart disease of native coronary artery without angina pectoris: Secondary | ICD-10-CM

## 2013-05-27 DIAGNOSIS — I10 Essential (primary) hypertension: Secondary | ICD-10-CM

## 2013-05-27 DIAGNOSIS — E785 Hyperlipidemia, unspecified: Secondary | ICD-10-CM

## 2013-05-27 NOTE — Patient Instructions (Addendum)
PLEASE SCHEDULE LEXISCAN DX 786.50  PLEASE FOLLOW UP SCOTT WEAVER, PAC IN ABOUT 4-6 WEEKS

## 2013-05-27 NOTE — Progress Notes (Signed)
[address] [place], [place]  [address] Phone: [phone] Fax:  [phone]  Date:  05/27/2013   ID:  Judy Perez, DOB 04/29/1930, MRN [id number]  PCP:  Pcp Not In System  Cardiologist:  Dr. Peter Swaziland     History of Present Illness: Judy Perez is a 79 y.o. female who returns for follow up after a recent admission to the hospital 5/28-5/30 with chest pain and a probable TIA.  She has a hx of CAD, status post stenting in [place] in 2004 (unknown vessel), DM2, HTN, HL, COPD, prior TIAs. She was initially seen by Dr. Daleen Squibb in consultation while in the hospital in 04/2013. She was seen for chest pain. She did not desire pursuing stress testing at that time and was d/c with plans for OP f/u. She was seen again by Dr. Swaziland on 05/12/13 during a separate admission. She was brought to the emergency room from her nursing home with chest pain. During evaluation, she developed altered mental status and speech difficulty.  She was seen by neurology.  Head and neck CTA demonstrated some carotid plaque (RICA 40%). EEG was negative.  Echo 5/14: EF 55%.she was placed on Plavix for stroke prevention.  No further cardiac workup was pursued in light of her need for neurologic evaluation.  She has had recurrent right-sided chest pain since discharge. She is not certain if this occurred wit exertion.  She denies dyspnea, syncope, orthopnea, PND or edema. She describes NYHA class IIb symptoms.  Labs (5/14):  K 4.1, Cr 0.98, ALT 12, LDL 106, Hgb 12.3  Wt Readings from Last 3 Encounters:  05/27/13 168 lb 12.8 oz (76.567 kg)  05/13/13 167 lb 1.7 oz (75.8 kg)  05/11/13 171 lb 11.8 oz (77.9 kg)     Past Medical History  Diagnosis Date  . CAD (coronary artery disease)     a. 2004 Cath/PCI with stenting x 1 in [place].  . Hypertension   . Hyperlipemia   . History of tobacco abuse     a. 50 pack yr hx, quit 2004  . History of shingles     a. left leg with residual pain.  .  Peripheral neuropathy   . Hard of hearing   . Fibrous breast lumps     "both sides; I did not have cancer" (05/13/2013)  . Family history of anesthesia complication     "took a long time to wake my mother up after one of her surgeries" (05/13/2013)  . Macular degeneration, wet     "both eyes" (05/13/2013)  . Type II diabetes mellitus   . Arthritis     "hands; maybe feet" (05/13/2013)  . TIA (transient ischemic attack)     Current Outpatient Prescriptions  Medication Sig Dispense Refill  . acetaminophen (TYLENOL) 325 MG tablet Take 325 mg by mouth every 6 (six) hours as needed for pain.      Marland Kitchen alendronate (FOSAMAX) 35 MG tablet Take 35 mg by mouth every 7 (seven) days. fridays      . aspirin 325 MG buffered tablet Take 325 mg by mouth daily.      . bimatoprost (LUMIGAN) 0.01 % SOLN Place 2 drops into both eyes daily.       . calcium-vitamin D (OSCAL WITH D) 500-200 MG-UNIT per tablet Take 1 tablet by mouth daily.      . cloNIDine (CATAPRES) 0.1 MG tablet Take 0.1 mg by mouth 2 (two) times daily.      . clopidogrel (  PLAVIX) 75 MG tablet Take 1 tablet (75 mg total) by mouth daily with breakfast.  30 tablet  0  . docusate sodium (COLACE) 100 MG capsule Take 100 mg by mouth 2 (two) times daily.      Marland Kitchen gabapentin (NEURONTIN) 300 MG capsule Take 300 mg by mouth at bedtime.      . isosorbide mononitrate (IMDUR) 30 MG 24 hr tablet Take 1 tablet (30 mg total) by mouth daily.  30 tablet  0  . loratadine (CLARITIN) 10 MG tablet Take 10 mg by mouth daily.      . metFORMIN (GLUCOPHAGE) 1000 MG tablet Take 1,000 mg by mouth daily with breakfast.      . Multiple Vitamin (TAB-A-VITE PO) Take 1 tablet by mouth daily.      . Multiple Vitamins-Minerals (I-VITE PO) Take 1 tablet by mouth daily.      . nebivolol (BYSTOLIC) 5 MG tablet Take 5 mg by mouth daily.      . polyvinyl alcohol (LIQUIFILM TEARS) 1.4 % ophthalmic solution Place 1 drop into both eyes 6 (six) times daily.      Marland Kitchen senna (SENOKOT) 8.6 MG TABS  Take 1 tablet by mouth daily.      . simvastatin (ZOCOR) 10 MG tablet Take 10 mg by mouth at bedtime.       No current facility-administered medications for this visit.    Allergies:   No Known Allergies  Social History:  The patient  reports that she quit smoking about 10 years ago. Her smoking use included Cigarettes. She has a 50 pack-year smoking history. She has never used smokeless tobacco. She reports that  drinks alcohol. She reports that she does not use illicit drugs.   ROS:  Please see the history of present illness.      All other systems reviewed and negative.   PHYSICAL EXAM: VS:  BP 142/80  Pulse 71  Ht 5\' 1"  (1.549 m)  Wt 168 lb 12.8 oz (76.567 kg)  BMI 31.91 kg/m2 Well nourished, well developed, in no acute distress HEENT: normal Neck: no JVD Cardiac:  normal S1, S2; RRR; no murmur Lungs:  clear to auscultation bilaterally, no wheezing, rhonchi or rales Abd: soft, nontender, no hepatomegaly Ext: no edema Skin: warm and dry Neuro:  CNs 2-12 intact, no focal abnormalities noted  EKG:  NSR, HR 71, no acute changes     ASSESSMENT AND PLAN:  1. Chest Pain:  Atypical.  But, her symptoms are concerning enough to send her to the hospital. She has a hx of CAD.  We discussed proceeding with a stress test vs continued med Rx.  She would like to pursue stress testing.  Arrange Lexiscan Myoview. 2. CAD:  Continue ASA, Plavix, statin.  Proceed with stress testing as noted.    3. Hx of TIA:  Continue ASA and Plavix.  No apparent recurrence. 4. Hypertension: Controlled. 5. Hyperlipidemia:  Continue statin. 6. Disposition: Follow up with me in one month.  Signed, Tereso Newcomer, PA-C  05/27/2013 11:32 AM

## 2013-06-03 ENCOUNTER — Ambulatory Visit (HOSPITAL_COMMUNITY): Payer: Medicare PPO | Attending: Physician Assistant | Admitting: Radiology

## 2013-06-03 VITALS — BP 127/58 | Ht 61.0 in | Wt 170.0 lb

## 2013-06-03 DIAGNOSIS — I2510 Atherosclerotic heart disease of native coronary artery without angina pectoris: Secondary | ICD-10-CM

## 2013-06-03 DIAGNOSIS — R079 Chest pain, unspecified: Secondary | ICD-10-CM | POA: Insufficient documentation

## 2013-06-03 DIAGNOSIS — E119 Type 2 diabetes mellitus without complications: Secondary | ICD-10-CM

## 2013-06-03 DIAGNOSIS — R5381 Other malaise: Secondary | ICD-10-CM | POA: Insufficient documentation

## 2013-06-03 MED ORDER — REGADENOSON 0.4 MG/5ML IV SOLN
0.4000 mg | Freq: Once | INTRAVENOUS | Status: AC
  Administered 2013-06-03: 0.4 mg via INTRAVENOUS

## 2013-06-03 MED ORDER — TECHNETIUM TC 99M SESTAMIBI GENERIC - CARDIOLITE
11.0000 | Freq: Once | INTRAVENOUS | Status: AC | PRN
  Administered 2013-06-03: 11 via INTRAVENOUS

## 2013-06-03 MED ORDER — TECHNETIUM TC 99M SESTAMIBI GENERIC - CARDIOLITE
33.0000 | Freq: Once | INTRAVENOUS | Status: AC | PRN
  Administered 2013-06-03: 33 via INTRAVENOUS

## 2013-06-03 NOTE — Progress Notes (Signed)
[facility] SITE 3 NUCLEAR MED [address] [place], [place] [address] [phone]    Cardiology Nuclear Med Study  Judy Perez is a 79 y.o. female     MRN : [id number]     DOB: 1930-12-04  Procedure Date: 06/03/2013  Nuclear Med Background Indication for Stress Test:  Evaluation for Ischemia, PTCA Patency and 5/26 & 05/12/13 Post Hospital: Chest pain, Negative enzymes History:  2004 [facility], PTCA,Stents Cardiac Risk Factors: Carotid Disease, History of Smoking, NIDDM and TIA  Symptoms:  Chest Pain and Fatigue   Nuclear Pre-Procedure Caffeine/Decaff Intake:  None NPO After: 5:30pm   Lungs:  clear O2 Sat: 99% on room air. IV 0.9% NS with Angio Cath:  20g  IV Site: L Antecubital  IV Started by:  Stanton Kidney, EMT-P  Chest Size (in):  36 Cup Size: C  Height: 5\' 1"  (1.549 m)  Weight:  170 lb (77.111 kg)  BMI:  Body mass index is 32.14 kg/(m^2). Tech Comments:  NA    Nuclear Med Study 1 or 2 day study: 1 day  Stress Test Type:  Lexiscan  Reading MD: Cassell Clement, MD  Order Authorizing Provider:  Peter Swaziland, MD  Resting Radionuclide: Technetium 63m Sestamibi  Resting Radionuclide Dose: 11.0 mCi   Stress Radionuclide:  Technetium 61m Sestamibi  Stress Radionuclide Dose: 33.0 mCi           Stress Protocol Rest HR: 63 Stress HR: 88  Rest BP: 127/58 Stress BP: 145/56  Exercise Time (min): n/a METS: n/a   Predicted Max HR: 140 bpm % Max HR: 62.86 bpm Rate Pressure Product: 47425   Dose of Adenosine (mg):  n/a Dose of Lexiscan: 0.4 mg  Dose of Atropine (mg): n/a Dose of Dobutamine: n/a mcg/kg/min (at max HR)  Stress Test Technologist: Bonnita Levan, RN  Nuclear Technologist:  Domenic Polite, CNMT     Rest Procedure:  Myocardial perfusion imaging was performed at rest 45 minutes following the intravenous administration of Technetium 20m Sestamibi. Rest ECG: NSR - Normal EKG  Stress Procedure:  The patient received IV Lexiscan 0.4 mg over  15-seconds.  Technetium 58m Sestamibi injected at 30-seconds.  Quantitative spect images were obtained after a 45 minute delay. Stress ECG: No significant change from baseline ECG  QPS Raw Data Images:  Normal; no motion artifact; normal heart/lung ratio. Stress Images:  Normal homogeneous uptake in all areas of the myocardium. Rest Images:  Normal homogeneous uptake in all areas of the myocardium. Subtraction (SDS):  No evidence of ischemia. Transient Ischemic Dilatation (Normal <1.22):  1.03 Lung/Heart Ratio (Normal <0.45):  0.29  Quantitative Gated Spect Images QGS EDV:  53 ml QGS ESV:  11 ml  Impression Exercise Capacity:  Lexiscan with no exercise. BP Response:  Normal blood pressure response. Clinical Symptoms:  There is dyspnea. ECG Impression:  No significant ST segment change suggestive of ischemia. Comparison with Prior Nuclear Study: No images to compare  Overall Impression:  Normal stress nuclear study.  LV Ejection Fraction: 79%.  LV Wall Motion:  NL LV Function; NL Wall Motion   [employer]

## 2013-06-04 ENCOUNTER — Telehealth: Payer: Self-pay | Admitting: *Deleted

## 2013-06-04 ENCOUNTER — Encounter (HOSPITAL_COMMUNITY): Payer: Self-pay | Admitting: Radiology

## 2013-06-04 ENCOUNTER — Encounter: Payer: Self-pay | Admitting: Physician Assistant

## 2013-06-04 NOTE — Telephone Encounter (Signed)
pt's sone notified about normal myoview, I stated that pt's home # just kept ringing. he verbalized understanding to me about the [facility] results

## 2013-07-07 ENCOUNTER — Ambulatory Visit: Payer: Medicare PPO | Admitting: Physician Assistant

## 2013-07-22 ENCOUNTER — Ambulatory Visit (INDEPENDENT_AMBULATORY_CARE_PROVIDER_SITE_OTHER): Payer: Medicare PPO | Admitting: Physician Assistant

## 2013-07-22 ENCOUNTER — Encounter: Payer: Self-pay | Admitting: Physician Assistant

## 2013-07-22 VITALS — BP 139/80 | HR 60 | Ht 61.0 in | Wt 172.0 lb

## 2013-07-22 DIAGNOSIS — I2510 Atherosclerotic heart disease of native coronary artery without angina pectoris: Secondary | ICD-10-CM

## 2013-07-22 DIAGNOSIS — I10 Essential (primary) hypertension: Secondary | ICD-10-CM

## 2013-07-22 DIAGNOSIS — E785 Hyperlipidemia, unspecified: Secondary | ICD-10-CM

## 2013-07-22 DIAGNOSIS — R079 Chest pain, unspecified: Secondary | ICD-10-CM

## 2013-07-22 NOTE — Patient Instructions (Addendum)
Your physician recommends that you continue on your current medications as directed. Please refer to the Current Medication list given to you today.  Your physician recommends that you schedule a follow-up appointment in: 6 months with Dr.Jordan

## 2013-07-22 NOTE — Progress Notes (Signed)
[address] [place], [place]  [address] Phone: [phone] Fax:  [phone]  Date:  07/22/2013   ID:  Judy Perez, DOB December 20, 1929, MRN [id number]  PCP:  Dr. Redmond School at [facility]  Cardiologist:  Dr. Peter Swaziland     History of Present Illness: Judy Perez is a 79 y.o. female who returns for follow up.  She had a recent admission to the hospital 04/2013 with chest pain and a probable TIA.  She has a hx of CAD, status post stenting in [place] in 2004 (unknown vessel), DM2, HTN, HL, COPD, prior TIAs. She was initially seen by Dr. Daleen Squibb in consultation while in the hospital in 04/2013. She was seen for chest pain. She did not desire pursuing stress testing at that time and was d/c with plans for OP f/u. She was seen again by Dr. Swaziland on 05/12/13 during a separate admission. She was brought to the emergency room from her nursing home with chest pain. During evaluation, she developed altered mental status and speech difficulty.  She was seen by neurology.  Head and neck CTA demonstrated some carotid plaque (RICA 40%). EEG was negative.  Echo 5/14: EF 55%.  She was placed on Plavix for stroke prevention.  No further cardiac workup was pursued in light of her need for neurologic evaluation.  I saw her in followup 05/27/13. She continued to have chest pain.  Stress testing was arranged. This demonstrated no ischemia and normal LV function.  She is here by herself.  She tells me she is doing well. The patient denies chest pain, shortness of breath, syncope, orthopnea, PND or significant pedal edema.   Labs (5/14):  K 4.1, Cr 0.98, ALT 12, LDL 106, Hgb 12.3  Wt Readings from Last 3 Encounters:  07/22/13 172 lb (78.019 kg)  06/03/13 170 lb (77.111 kg)  05/27/13 168 lb 12.8 oz (76.567 kg)     Past Medical History  Diagnosis Date  . CAD (coronary artery disease)     a. 2004 Cath/PCI with stenting x 1 in [place].;  Lexiscan Myoview 6/14:normal study, no ischemia, EF  79%  . Hypertension   . Hyperlipemia   . History of tobacco abuse     a. 50 pack yr hx, quit 2004  . History of shingles     a. left leg with residual pain.  . Peripheral neuropathy   . Hard of hearing   . Fibrous breast lumps     "both sides; I did not have cancer" (05/13/2013)  . Family history of anesthesia complication     "took a long time to wake my mother up after one of her surgeries" (05/13/2013)  . Macular degeneration, wet     "both eyes" (05/13/2013)  . Type II diabetes mellitus   . Arthritis     "hands; maybe feet" (05/13/2013)  . TIA (transient ischemic attack)     Current Outpatient Prescriptions  Medication Sig Dispense Refill  . acetaminophen (TYLENOL) 325 MG tablet Take 325 mg by mouth every 6 (six) hours as needed for pain.      Marland Kitchen alendronate (FOSAMAX) 35 MG tablet Take 35 mg by mouth every 7 (seven) days. fridays      . aspirin 325 MG buffered tablet Take 325 mg by mouth daily.      . bimatoprost (LUMIGAN) 0.01 % SOLN Place 2 drops into both eyes daily.       . calcium-vitamin D (OSCAL WITH D) 500-200 MG-UNIT per tablet Take  1 tablet by mouth daily.      . cloNIDine (CATAPRES) 0.1 MG tablet Take 0.1 mg by mouth 2 (two) times daily.      . clopidogrel (PLAVIX) 75 MG tablet Take 1 tablet (75 mg total) by mouth daily with breakfast.  30 tablet  0  . docusate sodium (COLACE) 100 MG capsule Take 100 mg by mouth 2 (two) times daily.      Marland Kitchen gabapentin (NEURONTIN) 300 MG capsule Take 300 mg by mouth at bedtime.      . isosorbide mononitrate (IMDUR) 30 MG 24 hr tablet Take 1 tablet (30 mg total) by mouth daily.  30 tablet  0  . loratadine (CLARITIN) 10 MG tablet Take 10 mg by mouth daily.      . metFORMIN (GLUCOPHAGE) 1000 MG tablet Take 1,000 mg by mouth daily with breakfast.      . Multiple Vitamin (TAB-A-VITE PO) Take 1 tablet by mouth daily.      . Multiple Vitamins-Minerals (I-VITE PO) Take 1 tablet by mouth daily.      . nebivolol (BYSTOLIC) 5 MG tablet Take 5 mg by  mouth daily.      . polyvinyl alcohol (LIQUIFILM TEARS) 1.4 % ophthalmic solution Place 1 drop into both eyes 6 (six) times daily.      Marland Kitchen senna (SENOKOT) 8.6 MG TABS Take 1 tablet by mouth daily.      . simvastatin (ZOCOR) 10 MG tablet Take 10 mg by mouth at bedtime.       No current facility-administered medications for this visit.    Allergies:   No Known Allergies  Social History:  The patient  reports that she quit smoking about 10 years ago. Her smoking use included Cigarettes. She has a 50 pack-year smoking history. She has never used smokeless tobacco. She reports that  drinks alcohol. She reports that she does not use illicit drugs.   ROS:  Please see the history of present illness.     All other systems reviewed and negative.   PHYSICAL EXAM: VS:  BP 139/80  Pulse 60  Ht 5\' 1"  (1.549 m)  Wt 172 lb (78.019 kg)  BMI 32.52 kg/m2 Well nourished, well developed, in no acute distress HEENT: normal Neck: no JVD Cardiac:  normal S1, S2; RRR; no murmur Lungs:  clear to auscultation bilaterally, no wheezing, rhonchi or rales Abd: soft, nontender, no hepatomegaly Ext: trace bilateral LE edema Skin: warm and dry Neuro:  CNs 2-12 intact, no focal abnormalities noted  EKG:  NSR, HR 60, normal     ASSESSMENT AND PLAN:  1. CAD:  No angina.  Recent myoview low risk.  Continue ASA, Plavix, statin.   2. Hx of TIA:  Continue ASA and Plavix.   3. Hypertension:  Controlled. 4. Hyperlipidemia:  With advanced age, lipids acceptable.  Continue statin. 5. Disposition: Follow up with me or Dr. Peter Swaziland in 6 mos.  Signed, Tereso Newcomer, PA-C  07/22/2013 10:24 AM

## 2013-11-15 ENCOUNTER — Emergency Department (HOSPITAL_COMMUNITY): Payer: Medicare PPO

## 2013-11-15 ENCOUNTER — Emergency Department (HOSPITAL_COMMUNITY)
Admission: EM | Admit: 2013-11-15 | Discharge: 2013-11-15 | Disposition: A | Discharge status: Skilled Nursing Facility | Payer: Medicare PPO | Attending: Emergency Medicine | Admitting: Emergency Medicine

## 2013-11-15 DIAGNOSIS — E119 Type 2 diabetes mellitus without complications: Secondary | ICD-10-CM | POA: Insufficient documentation

## 2013-11-15 DIAGNOSIS — Z79899 Other long term (current) drug therapy: Secondary | ICD-10-CM | POA: Insufficient documentation

## 2013-11-15 DIAGNOSIS — Z8669 Personal history of other diseases of the nervous system and sense organs: Secondary | ICD-10-CM | POA: Insufficient documentation

## 2013-11-15 DIAGNOSIS — Y939 Activity, unspecified: Secondary | ICD-10-CM | POA: Insufficient documentation

## 2013-11-15 DIAGNOSIS — S0990XA Unspecified injury of head, initial encounter: Secondary | ICD-10-CM | POA: Insufficient documentation

## 2013-11-15 DIAGNOSIS — W19XXXA Unspecified fall, initial encounter: Secondary | ICD-10-CM

## 2013-11-15 DIAGNOSIS — Z8619 Personal history of other infectious and parasitic diseases: Secondary | ICD-10-CM | POA: Insufficient documentation

## 2013-11-15 DIAGNOSIS — E785 Hyperlipidemia, unspecified: Secondary | ICD-10-CM | POA: Insufficient documentation

## 2013-11-15 DIAGNOSIS — Z8742 Personal history of other diseases of the female genital tract: Secondary | ICD-10-CM | POA: Insufficient documentation

## 2013-11-15 DIAGNOSIS — M19049 Primary osteoarthritis, unspecified hand: Secondary | ICD-10-CM | POA: Insufficient documentation

## 2013-11-15 DIAGNOSIS — Z7902 Long term (current) use of antithrombotics/antiplatelets: Secondary | ICD-10-CM | POA: Insufficient documentation

## 2013-11-15 DIAGNOSIS — I2510 Atherosclerotic heart disease of native coronary artery without angina pectoris: Secondary | ICD-10-CM | POA: Insufficient documentation

## 2013-11-15 DIAGNOSIS — Z8673 Personal history of transient ischemic attack (TIA), and cerebral infarction without residual deficits: Secondary | ICD-10-CM | POA: Insufficient documentation

## 2013-11-15 DIAGNOSIS — Z7982 Long term (current) use of aspirin: Secondary | ICD-10-CM | POA: Insufficient documentation

## 2013-11-15 DIAGNOSIS — Z87891 Personal history of nicotine dependence: Secondary | ICD-10-CM | POA: Insufficient documentation

## 2013-11-15 DIAGNOSIS — W1809XA Striking against other object with subsequent fall, initial encounter: Secondary | ICD-10-CM | POA: Insufficient documentation

## 2013-11-15 DIAGNOSIS — Z9861 Coronary angioplasty status: Secondary | ICD-10-CM | POA: Insufficient documentation

## 2013-11-15 DIAGNOSIS — Y9210 Unspecified residential institution as the place of occurrence of the external cause: Secondary | ICD-10-CM | POA: Insufficient documentation

## 2013-11-15 DIAGNOSIS — I10 Essential (primary) hypertension: Secondary | ICD-10-CM | POA: Insufficient documentation

## 2013-11-15 LAB — URINALYSIS, ROUTINE W REFLEX MICROSCOPIC
Glucose, UA: NEGATIVE mg/dL
Hgb urine dipstick: NEGATIVE
Leukocytes, UA: NEGATIVE
Protein, ur: NEGATIVE mg/dL
pH: 5 (ref 5.0–8.0)

## 2013-11-15 NOTE — ED Notes (Signed)
PT reports bending over to pick up mail, falling backwards striking head. Reports initial pain but denies at this time. AO x4. Neuro intact. Moves all extremities x 4. VSS. PERRLA.

## 2013-11-15 NOTE — ED Provider Notes (Signed)
CSN: [id number]     Arrival date & time 11/15/13  1256 History   First MD Initiated Contact with Patient 11/15/13 1419     Chief Complaint  Patient presents with  . Fall    HPI  Judy Perez is a 79 y.o. female with a PMH of CAD s/p stent, HTN, HLD, hx of tobacco abuse, peripheral neuropathy, type II DM, arthritis, and TIA who presents to the ED for evaluation of a fall.  History was provided by the patient.  Patient states that she was from [facility].  Patient states that she was pending down to get an envelope on the floor at 11:30 AM today when she lost her balance and fell backwards hitting the back of her head.  She denies any syncope or LOC.  She denies a mild headache on the right back of her head.  She denies any neck pain, chest pain, SOB, abdominal pain, or extremity pain.  She is on Plavix.  Patient lives at [facility].     Past Medical History  Diagnosis Date  . CAD (coronary artery disease)     a. 2004 Cath/PCI with stenting x 1 in [place].;  Lexiscan Myoview 6/14:normal study, no ischemia, EF 79%  . Hypertension   . Hyperlipemia   . History of tobacco abuse     a. 50 pack yr hx, quit 2004  . History of shingles     a. left leg with residual pain.  . Peripheral neuropathy   . Hard of hearing   . Fibrous breast lumps     "both sides; I did not have cancer" (05/13/2013)  . Family history of anesthesia complication     "took a long time to wake my mother up after one of her surgeries" (05/13/2013)  . Macular degeneration, wet     "both eyes" (05/13/2013)  . Type II diabetes mellitus   . Arthritis     "hands; maybe feet" (05/13/2013)  . TIA (transient ischemic attack)    Past Surgical History  Procedure Laterality Date  . Tonsillectomy    . Appendectomy    . Carpal tunnel release Bilateral   . Mastectomy Bilateral 1980's    "multiple fibrous cysts; never had cancer" (05/13/2013)20  . Cesarean section  1960; 1962  . Inner ear surgery Left 1962    "put a  piece of steel and a tube & a vein from my ?left hand in" (05/13/2013)  . Cataract extraction w/ intraocular lens  implant, bilateral Bilateral 2000  . Breast biopsy    . Tubal ligation  1963  . Coronary angioplasty with stent placement  2004    "1" (05/13/2013)   Family History  Problem Relation Age of Onset  . Stroke Father     deceased  . Other Mother     deceased - possibly renal failure   History  Substance Use Topics  . Smoking status: Former Smoker -- 1.00 packs/day for 50 years    Types: Cigarettes    Quit date: 05/12/2003  . Smokeless tobacco: Never Used  . Alcohol Use: Yes     Comment: 05/13/2013 "socially; very seldom anymore"   OB History   Grav Para Term Preterm Abortions TAB SAB Ect Mult Living                 Review of Systems  Constitutional: Negative for fatigue.  Eyes: Negative for photophobia and visual disturbance.  Respiratory: Negative for shortness of breath.   Cardiovascular: Negative for  chest pain, palpitations and leg swelling.  Gastrointestinal: Negative for nausea, vomiting and abdominal pain.  Genitourinary: Negative for hematuria.  Musculoskeletal: Negative for arthralgias, back pain, gait problem, joint swelling, myalgias and neck pain.  Skin: Negative for color change and wound.  Neurological: Positive for headaches. Negative for dizziness, syncope, speech difficulty, weakness, light-headedness and numbness.  Psychiatric/Behavioral: Negative for confusion.    Allergies  Review of patient's allergies indicates no known allergies.  Home Medications   Current Outpatient Rx  Name  Route  Sig  Dispense  Refill  . acetaminophen (TYLENOL) 500 MG tablet   Oral   Take 500 mg by mouth 2 (two) times daily.         Marland Kitchen aspirin 325 MG buffered tablet   Oral   Take 325 mg by mouth daily.         . bimatoprost (LUMIGAN) 0.01 % SOLN   Both Eyes   Place 2 drops into both eyes daily.          . calcium-vitamin D (OSCAL WITH D) 500-200  MG-UNIT per tablet   Oral   Take 1 tablet by mouth daily.         . capsicum oleoresin (TRIXAICIN) 0.025 % cream   Topical   Apply 1 application topically 2 (two) times daily.         . cloNIDine (CATAPRES) 0.1 MG tablet   Oral   Take 0.1 mg by mouth 2 (two) times daily.         . clopidogrel (PLAVIX) 75 MG tablet   Oral   Take 1 tablet (75 mg total) by mouth daily with breakfast.   30 tablet   0   . docusate sodium (COLACE) 100 MG capsule   Oral   Take 100 mg by mouth 2 (two) times daily.         Marland Kitchen guaiFENesin (MUCINEX) 600 MG 12 hr tablet   Oral   Take 600 mg by mouth 2 (two) times daily.         Marland Kitchen ibandronate (BONIVA) 150 MG tablet   Oral   Take 150 mg by mouth every 30 (thirty) days. Take in the morning with a full glass of water, on an empty stomach, and do not take anything else by mouth or lie down for the next 30 min.         . isosorbide mononitrate (IMDUR) 30 MG 24 hr tablet   Oral   Take 1 tablet (30 mg total) by mouth daily.   30 tablet   0   . lisinopril (PRINIVIL,ZESTRIL) 5 MG tablet   Oral   Take 5 mg by mouth daily.         . metFORMIN (GLUCOPHAGE) 1000 MG tablet   Oral   Take 1,000 mg by mouth daily with breakfast.         . Multiple Vitamins-Minerals (I-VITE PO)   Oral   Take 1 tablet by mouth daily.         . nebivolol (BYSTOLIC) 5 MG tablet   Oral   Take 5 mg by mouth daily.         . polyvinyl alcohol (LIQUIFILM TEARS) 1.4 % ophthalmic solution   Both Eyes   Place 1 drop into both eyes 6 (six) times daily.         . pregabalin (LYRICA) 150 MG capsule   Oral   Take 150 mg by mouth at bedtime.         Marland Kitchen  pregabalin (LYRICA) 50 MG capsule   Oral   Take 50 mg by mouth daily.         Marland Kitchen senna (SENOKOT) 8.6 MG TABS   Oral   Take 1 tablet by mouth daily.         . simvastatin (ZOCOR) 10 MG tablet   Oral   Take 10 mg by mouth at bedtime.          BP 164/68  Pulse 60  Temp(Src) 98.4 F (36.9 C) (Oral)   Resp 16  SpO2 95%  Filed Vitals:   11/15/13 1330 11/15/13 1400 11/15/13 1622 11/15/13 1700  BP: 187/71 164/68 144/63 165/69  Pulse: 66 60  58  Temp:      TempSrc:      Resp: 16 16 16 16   SpO2: 96% 95% 95% 96%    Physical Exam  Nursing note and vitals reviewed. Constitutional: She is oriented to person, place, and time. She appears well-developed and well-nourished. No distress.  HENT:  Head: Normocephalic and atraumatic.  Right Ear: External ear normal.  Left Ear: External ear normal.  Nose: Nose normal.  Mouth/Throat: Oropharynx is clear and moist. No oropharyngeal exudate.  No tenderness to palpation to the scalp throughout.  No palpable hematoma, step-offs, or lacerations.  TM's gray and translucent bilaterally.    Eyes: Conjunctivae and EOM are normal. Pupils are equal, round, and reactive to light. Right eye exhibits no discharge. Left eye exhibits no discharge.  Neck: Normal range of motion. Neck supple.  No cervical spinal tenderness to palpation.  No limitations or increased pain with ROM.    Cardiovascular: Normal rate, regular rhythm, normal heart sounds and intact distal pulses.  Exam reveals no gallop and no friction rub.   No murmur heard. Radial and dorsalis pedis pulses present and equal bilaterally  Pulmonary/Chest: Effort normal and breath sounds normal. No respiratory distress. She has no wheezes. She has no rales. She exhibits no tenderness.  Abdominal: Soft. Bowel sounds are normal. She exhibits no distension and no mass. There is no tenderness. There is no rebound and no guarding.  Musculoskeletal: Normal range of motion. She exhibits no edema and no tenderness.  No tenderness to palpation to the UE and LE throughout.  Strength 5/5 in the UE and LE bilaterally.  Patient able to ambulate without difficulty or ataxia.   Neurological: She is alert and oriented to person, place, and time.  GCS 15.  No focal neurological deficits.  CN 2-12 intact.  No pronator  drift.  Finger to nose intact.    Skin: Skin is warm and dry. She is not diaphoretic.    ED Course  Procedures (including critical care time) Labs Review Labs Reviewed  GLUCOSE, CAPILLARY  URINALYSIS, ROUTINE W REFLEX MICROSCOPIC   Imaging Review Ct Head Wo Contrast  11/15/2013   CLINICAL DATA:  Fall with head injury.  Patient on Plavix.  EXAM: CT HEAD WITHOUT CONTRAST  CT CERVICAL SPINE WITHOUT CONTRAST  TECHNIQUE: Multidetector CT imaging of the head and cervical spine was performed following the standard protocol without intravenous contrast. Multiplanar CT image reconstructions of the cervical spine were also generated.  COMPARISON:  05/13/2013  FINDINGS: CT HEAD FINDINGS  Ventricles and cisterns are within normal. There is a small old right periventricular infarct. There is chronic ischemic microvascular disease. There is no mass, mass effect, shift of midline structures or acute hemorrhage. There is no evidence to suggest acute ischemia. Remaining bones and soft tissues are unchanged.  CT CERVICAL SPINE FINDINGS  Vertebral body alignment and heights are within normal. There is moderate to severe spondylosis. There is moderate disc space narrowing from the C4-5 level to the C6-7 level. Prevertebral soft tissues as well as the atlantoaxial articulation are within normal. There is moderate uncovertebral joint spurring and facet arthropathy. There is neural foraminal narrowing at multiple levels. There is no acute fracture or subluxation. Remainder of the exam is unremarkable.  IMPRESSION: No acute intracranial findings.  No acute cervical spine injury.  Chronic ischemic microvascular disease and old small right periventricular infarct.  Moderate to severe spondylosis of the cervical spinal with multilevel degenerative disc disease and multilevel neural foraminal narrowing.   Electronically Signed   By: Elberta Fortis M.D.   On: 11/15/2013 14:31   Ct Cervical Spine Wo Contrast  11/15/2013   CLINICAL  DATA:  Fall with head injury.  Patient on Plavix.  EXAM: CT HEAD WITHOUT CONTRAST  CT CERVICAL SPINE WITHOUT CONTRAST  TECHNIQUE: Multidetector CT imaging of the head and cervical spine was performed following the standard protocol without intravenous contrast. Multiplanar CT image reconstructions of the cervical spine were also generated.  COMPARISON:  05/13/2013  FINDINGS: CT HEAD FINDINGS  Ventricles and cisterns are within normal. There is a small old right periventricular infarct. There is chronic ischemic microvascular disease. There is no mass, mass effect, shift of midline structures or acute hemorrhage. There is no evidence to suggest acute ischemia. Remaining bones and soft tissues are unchanged.  CT CERVICAL SPINE FINDINGS  Vertebral body alignment and heights are within normal. There is moderate to severe spondylosis. There is moderate disc space narrowing from the C4-5 level to the C6-7 level. Prevertebral soft tissues as well as the atlantoaxial articulation are within normal. There is moderate uncovertebral joint spurring and facet arthropathy. There is neural foraminal narrowing at multiple levels. There is no acute fracture or subluxation. Remainder of the exam is unremarkable.  IMPRESSION: No acute intracranial findings.  No acute cervical spine injury.  Chronic ischemic microvascular disease and old small right periventricular infarct.  Moderate to severe spondylosis of the cervical spinal with multilevel degenerative disc disease and multilevel neural foraminal narrowing.   Electronically Signed   By: Elberta Fortis M.D.   On: 11/15/2013 14:31    EKG Interpretation   None      Results for orders placed during the hospital encounter of 11/15/13  GLUCOSE, CAPILLARY      Result Value Range   Glucose-Capillary 94  70 - 99 mg/dL  URINALYSIS, ROUTINE W REFLEX MICROSCOPIC      Result Value Range   Color, Urine YELLOW  YELLOW   APPearance CLEAR  CLEAR   Specific Gravity, Urine 1.014  1.005  - 1.030   pH 5.0  5.0 - 8.0   Glucose, UA NEGATIVE  NEGATIVE mg/dL   Hgb urine dipstick NEGATIVE  NEGATIVE   Bilirubin Urine NEGATIVE  NEGATIVE   Ketones, ur NEGATIVE  NEGATIVE mg/dL   Protein, ur NEGATIVE  NEGATIVE mg/dL   Urobilinogen, UA 0.2  0.0 - 1.0 mg/dL   Nitrite NEGATIVE  NEGATIVE   Leukocytes, UA NEGATIVE  NEGATIVE        CT Head Wo Contrast (Final result)  Result time: 11/15/13 14:31:09    Final result by Rad Results In Interface (11/15/13 14:31:09)    Narrative:   CLINICAL DATA: Fall with head injury. Patient on Plavix.  EXAM: CT HEAD WITHOUT CONTRAST  CT CERVICAL SPINE WITHOUT CONTRAST  TECHNIQUE: Multidetector CT imaging of the head and cervical spine was performed following the standard protocol without intravenous contrast. Multiplanar CT image reconstructions of the cervical spine were also generated.  COMPARISON: 05/13/2013  FINDINGS: CT HEAD FINDINGS  Ventricles and cisterns are within normal. There is a small old right periventricular infarct. There is chronic ischemic microvascular disease. There is no mass, mass effect, shift of midline structures or acute hemorrhage. There is no evidence to suggest acute ischemia. Remaining bones and soft tissues are unchanged.  CT CERVICAL SPINE FINDINGS  Vertebral body alignment and heights are within normal. There is moderate to severe spondylosis. There is moderate disc space narrowing from the C4-5 level to the C6-7 level. Prevertebral soft tissues as well as the atlantoaxial articulation are within normal. There is moderate uncovertebral joint spurring and facet arthropathy. There is neural foraminal narrowing at multiple levels. There is no acute fracture or subluxation. Remainder of the exam is unremarkable.  IMPRESSION: No acute intracranial findings.  No acute cervical spine injury.  Chronic ischemic microvascular disease and old small right periventricular infarct.  Moderate to severe  spondylosis of the cervical spinal with multilevel degenerative disc disease and multilevel neural foraminal narrowing.   Electronically Signed By: Elberta Fortis M.D. On: 11/15/2013 14:31             CT Cervical Spine Wo Contrast (Final result)  Result time: 11/15/13 14:31:09    Final result by Rad Results In Interface (11/15/13 14:31:09)    Narrative:   CLINICAL DATA: Fall with head injury. Patient on Plavix.  EXAM: CT HEAD WITHOUT CONTRAST  CT CERVICAL SPINE WITHOUT CONTRAST  TECHNIQUE: Multidetector CT imaging of the head and cervical spine was performed following the standard protocol without intravenous contrast. Multiplanar CT image reconstructions of the cervical spine were also generated.  COMPARISON: 05/13/2013  FINDINGS: CT HEAD FINDINGS  Ventricles and cisterns are within normal. There is a small old right periventricular infarct. There is chronic ischemic microvascular disease. There is no mass, mass effect, shift of midline structures or acute hemorrhage. There is no evidence to suggest acute ischemia. Remaining bones and soft tissues are unchanged.  CT CERVICAL SPINE FINDINGS  Vertebral body alignment and heights are within normal. There is moderate to severe spondylosis. There is moderate disc space narrowing from the C4-5 level to the C6-7 level. Prevertebral soft tissues as well as the atlantoaxial articulation are within normal. There is moderate uncovertebral joint spurring and facet arthropathy. There is neural foraminal narrowing at multiple levels. There is no acute fracture or subluxation. Remainder of the exam is unremarkable.  IMPRESSION: No acute intracranial findings.  No acute cervical spine injury.  Chronic ischemic microvascular disease and old small right periventricular infarct.  Moderate to severe spondylosis of the cervical spinal with multilevel degenerative disc disease and multilevel neural  foraminal narrowing.   Electronically Signed By: Elberta Fortis M.D. On: 11/15/2013 14:31            MDM   Judy Perez is a 79 y.o. female  with a PMH of CAD s/p stent, HTN, HLD, hx of tobacco abuse, peripheral neuropathy, type II DM, arthritis, and TIA who presents to the ED for evaluation of a fall.  Rechecks  5:15 PM = Patient ambulating in hallway without difficulty.  No pain or concerns.      Patient evaluated for a mechanical fall.  No syncope or LOC.  Her head CT was negative for any acute intracranial abnormalities.  Her neck CT was  negative for fracture or malalignment.  She was neurovascularly intact with no focal neurological deficits.  She was instructed to stop Plavix for two days and then resume.  Return precautions were discussed.  Patient transferred back to nursing home.  She remained in no acute distress throughout her ED visit.     Final impressions: 1. Fall at nursing home, initial encounter   2. Head injury, initial encounter      Luiz Iron PA-C   This patient was discussed with Dr. Barry Dienes, PA-C 11/17/13 1310

## 2013-11-15 NOTE — ED Notes (Signed)
[facility] contacted for transport 

## 2013-11-15 NOTE — ED Notes (Signed)
[facility] arrived to transport patient. 

## 2013-11-15 NOTE — ED Notes (Addendum)
Per EMS: Pt from [facility]; pt reaching down and fell backwards hitting head, denies LOC, reports back of neck pain. AO x4. Moves all extremities, no deformities noted. Hx: takes Plavix, HTN, diabetes 180/88. 64bpm.

## 2013-11-17 NOTE — ED Provider Notes (Signed)
Medical screening examination/treatment/procedure(s) were performed by non-physician practitioner and as supervising physician I was immediately available for consultation/collaboration.  EKG Interpretation   None         Charles B. Bernette Mayers, MD 11/17/13 2000

## 2014-01-12 ENCOUNTER — Encounter: Payer: Self-pay | Admitting: Podiatrist

## 2014-01-12 ENCOUNTER — Ambulatory Visit (INDEPENDENT_AMBULATORY_CARE_PROVIDER_SITE_OTHER): Payer: Medicare PPO | Admitting: Podiatrist

## 2014-01-12 VITALS — BP 148/67 | HR 56 | Resp 16 | Ht 61.0 in | Wt 173.0 lb

## 2014-01-12 DIAGNOSIS — L03039 Cellulitis of unspecified toe: Secondary | ICD-10-CM

## 2014-01-12 MED ORDER — CEPHALEXIN 500 MG PO CAPS: 500.0000 mg | ORAL_CAPSULE | Freq: Three times a day (TID) | ORAL | Status: DC

## 2014-01-12 NOTE — Progress Notes (Addendum)
Subjective:     Patient ID: Judy Perez, female   DOB: Jul 08, 1930, 80 y.o.   MRN: [id number]  HPI Comments: "I have a real sore toe"  Patient has a very tender, throbbing 1st toe left foot, both corners of the nail, for about 3 weeks. It is red and swollen. Can't wear regular shoes. She stays in a nursing facility and they have been putting cream on it.  she also states she has painful neuropathy on bilateral feet which is burning and tingling in nature. She was to know if anything can be done about this as well.   Review of Systems  HENT: Positive for hearing loss.   Cardiovascular:       Calf pain with walking  Musculoskeletal: Positive for gait problem.  Skin:       Change in nails  Neurological: Positive for dizziness.  All other systems reviewed and are negative.       Objective:   Physical Exam vascular exam reveals faintly palpable pedal pulses at one out of four DP and PT bilateral. PT pulses unable to be palpated. She has swelling of her left greater than right foot which is pitting in nature. Neurological sensation is decreased bilateral with peripheral neuropathy noted and decrease in sensation of Semmes Weinstein monofilament at 0/5 sites bilateral.. Left hallux nail has an ingrown border on both sides. It is painful with palpation and pressure of shoes. Minimal redness and swelling is seen.     Assessment:      paronychia left hallux both borders    Plan:     Plan: Treatment options and alternatives discussed.  Recommended an incision and drainage and patient agreed.  left was prepped with alcohol and a 1 to 1 mix of 0.5% marcaine plain and 2% lidocaine plain was administered in a digital block fashion.  The toe was then prepped with betadine solution and exsanguinated.  The offending nail border was then excised and all necrotic tissue was resected.  The area was then cleansed well and antibiotic ointment and a dry sterile dressing was applied.  The patient was  dispensed instructions for aftercare.   I also recommended a topical compounding cream for her neuropathy. I will order this for her and have it sent to her home.

## 2014-01-12 NOTE — Patient Instructions (Signed)

## 2014-01-13 ENCOUNTER — Encounter: Payer: Self-pay | Admitting: Podiatrist

## 2014-01-18 ENCOUNTER — Telehealth: Payer: Self-pay | Admitting: *Deleted

## 2014-01-18 NOTE — Progress Notes (Signed)
Orders to [facility] sent for Neuropathy-Musculoskeletal cream containing Amantadine 3%, Carbamazepine 3%, Gabapentin 6%, Pentoxifyline 3% dispense 240gm with 5 refills.

## 2014-01-18 NOTE — Telephone Encounter (Signed)
[phone] and [facility] [phone].

## 2014-04-07 ENCOUNTER — Encounter: Payer: Self-pay | Admitting: Podiatrist

## 2014-04-07 ENCOUNTER — Ambulatory Visit (INDEPENDENT_AMBULATORY_CARE_PROVIDER_SITE_OTHER): Payer: Medicare PPO | Admitting: Podiatrist

## 2014-04-07 VITALS — BP 162/80 | HR 69 | Resp 12

## 2014-04-07 DIAGNOSIS — B351 Tinea unguium: Secondary | ICD-10-CM

## 2014-04-07 DIAGNOSIS — M79609 Pain in unspecified limb: Secondary | ICD-10-CM

## 2014-04-07 DIAGNOSIS — E119 Type 2 diabetes mellitus without complications: Secondary | ICD-10-CM

## 2014-04-07 DIAGNOSIS — L03039 Cellulitis of unspecified toe: Secondary | ICD-10-CM

## 2014-04-07 NOTE — Progress Notes (Signed)
Subjective:  Patient presents for foot and nail care.  She is diabetic.  She has severely incurvated toenails and has difficulty trimming them herself.  She has bruising to the 2nd toe bilateral left greater than right.    Physical Exam  vascular exam reveals faintly palpable pedal pulses at one out of four DP and PT bilateral. PT pulses unable to be palpated. She has swelling of her left greater than right foot which is pitting in nature. Neurological sensation is decreased bilateral with peripheral neuropathy noted and decrease in sensation of Semmes Weinstein monofilament at 0/5 sites bilateral.. Left hallux nail has an ingrown border on both sides. It is painful with palpation and pressure of shoes. Minimal redness and swelling is seen.  Objective:  Diabetes with neuropathy , ingrowing nail deformity,   Plan:  Debrided the toenails today  Dispensed a silicone protector for her bruised 2nd toes.  She will be seen back in 3 months for follow up.

## 2014-05-11 ENCOUNTER — Ambulatory Visit: Payer: Medicare PPO | Admitting: Podiatrist

## 2014-05-23 ENCOUNTER — Ambulatory Visit (INDEPENDENT_AMBULATORY_CARE_PROVIDER_SITE_OTHER): Payer: Medicare PPO | Admitting: Cardiology

## 2014-05-23 ENCOUNTER — Encounter: Payer: Self-pay | Admitting: Cardiology

## 2014-05-23 VITALS — BP 128/64 | HR 74 | Ht 61.0 in | Wt 171.1 lb

## 2014-05-23 DIAGNOSIS — R079 Chest pain, unspecified: Secondary | ICD-10-CM

## 2014-05-23 DIAGNOSIS — G629 Polyneuropathy, unspecified: Secondary | ICD-10-CM

## 2014-05-23 DIAGNOSIS — E119 Type 2 diabetes mellitus without complications: Secondary | ICD-10-CM

## 2014-05-23 DIAGNOSIS — G609 Hereditary and idiopathic neuropathy, unspecified: Secondary | ICD-10-CM

## 2014-05-23 HISTORY — DX: Polyneuropathy, unspecified: G62.9

## 2014-05-23 NOTE — Patient Instructions (Signed)
Continue your current therapy  I will see you in 1 year.   

## 2014-05-23 NOTE — Progress Notes (Signed)
[address], [address] [place], [place]  [address] Phone: [phone] Fax:  [phone]  Date:  05/23/2014   ID:  Judy Perez, DOB October 13, 1930, MRN [id number]  PCP:  Dr. [facility]  History of Present Illness: Judy Perez is a 80 y.o. female who returns for follow up.    She has a hx of CAD, status post stenting in [place] in 2004 (unknown vessel), DM2, HTN, HL, COPD, prior TIAs. She was hospitalized in May 2014 for chest pain. Subsequent cardiac work up included an  Echo 5/14: EF 55%.  She was placed on Plavix for stroke prevention. Myoview study was normal. On follow up today she denies any recurrent chest pain or SOB. She is very limited by neuropathy and walks with a walker.   Wt Readings from Last 3 Encounters:  05/23/14 171 lb 1.9 oz (77.62 kg)  01/12/14 173 lb (78.472 kg)  07/22/13 172 lb (78.019 kg)     Past Medical History  Diagnosis Date  . CAD (coronary artery disease)     a. 2004 Cath/PCI with stenting x 1 in [place].;  Lexiscan Myoview 6/14:normal study, no ischemia, EF 79%  . Hypertension   . Hyperlipemia   . History of tobacco abuse     a. 50 pack yr hx, quit 2004  . History of shingles     a. left leg with residual pain.  . Peripheral neuropathy   . Hard of hearing   . Fibrous breast lumps     "both sides; I did not have cancer" (05/13/2013)  . Family history of anesthesia complication     "took a long time to wake my mother up after one of her surgeries" (05/13/2013)  . Macular degeneration, wet     "both eyes" (05/13/2013)  . Type II diabetes mellitus   . Arthritis     "hands; maybe feet" (05/13/2013)  . TIA (transient ischemic attack)   . Peripheral neuropathy 05/23/2014    Current Outpatient Prescriptions  Medication Sig Dispense Refill  . acetaminophen (TYLENOL) 500 MG tablet Take 500 mg by mouth 2 (two) times daily.      Marland Kitchen aspirin 325 MG buffered tablet Take 325 mg by mouth daily.      . bimatoprost (LUMIGAN) 0.01 % SOLN  Place 2 drops into both eyes daily.       . calcium-vitamin D (OSCAL WITH D) 500-200 MG-UNIT per tablet Take 1 tablet by mouth daily.      . capsicum oleoresin (TRIXAICIN) 0.025 % cream Apply 1 application topically 2 (two) times daily.      . cloNIDine (CATAPRES) 0.1 MG tablet Take 0.1 mg by mouth 2 (two) times daily.      . clopidogrel (PLAVIX) 75 MG tablet Take 1 tablet (75 mg total) by mouth daily with breakfast.  30 tablet  0  . docusate sodium (COLACE) 100 MG capsule Take 100 mg by mouth 2 (two) times daily.      Marland Kitchen guaiFENesin (MUCINEX) 600 MG 12 hr tablet Take 600 mg by mouth 2 (two) times daily.      Marland Kitchen ibandronate (BONIVA) 150 MG tablet Take 150 mg by mouth every 30 (thirty) days. Take in the morning with a full glass of water, on an empty stomach, and do not take anything else by mouth or lie down for the next 30 min.      . isosorbide mononitrate (IMDUR) 30 MG 24 hr tablet Take 1 tablet (30 mg total) by mouth  daily.  30 tablet  0  . lisinopril (PRINIVIL,ZESTRIL) 5 MG tablet Take 5 mg by mouth daily.      . metFORMIN (GLUCOPHAGE) 1000 MG tablet Take 1,000 mg by mouth daily with breakfast.      . Multiple Vitamins-Minerals (I-VITE PO) Take 1 tablet by mouth daily.      . nebivolol (BYSTOLIC) 5 MG tablet Take 5 mg by mouth daily.      Marland Kitchen. Neomycin-Bacitracin-Polymyxin (TRIPLE ANTIBIOTIC EX) Apply topically as directed.      . polyvinyl alcohol (LIQUIFILM TEARS) 1.4 % ophthalmic solution Place 1 drop into both eyes 6 (six) times daily.      . pregabalin (LYRICA) 150 MG capsule Take 100 mg by mouth 2 (two) times daily.       Marland Kitchen. senna (SENOKOT) 8.6 MG TABS Take 1 tablet by mouth daily.      . simvastatin (ZOCOR) 10 MG tablet Take 10 mg by mouth at bedtime.       No current facility-administered medications for this visit.    Allergies:   No Known Allergies  Social History:  The patient  reports that she quit smoking about 11 years ago. Her smoking use included Cigarettes. She has a 50  pack-year smoking history. She has never used smokeless tobacco. She reports that she drinks alcohol. She reports that she does not use illicit drugs.   ROS:  Please see the history of present illness.  She complains of pain at the base of her right thumb.    All other systems reviewed and negative.   PHYSICAL EXAM: VS:  BP 128/64  Pulse 74  Ht 5\' 1"  (1.549 m)  Wt 171 lb 1.9 oz (77.62 kg)  BMI 32.35 kg/m2 Well nourished, well developed, in no acute distress HEENT: normal Neck: no JVD Cardiac:  normal S1, S2; RRR; no murmur Lungs:  clear to auscultation bilaterally, no wheezing, rhonchi or rales Abd: soft, nontender, no hepatomegaly Ext: trace bilateral LE edema Skin: warm and dry Neuro:  CNs 2-12 intact, no focal abnormalities noted    ASSESSMENT AND PLAN:  1. CAD:  No angina.  prior myoview normal.  Continue ASA, Plavix, statin.  Also on beta blocker and nitrates. I will follow up in one year.  2. Hx of TIA:  Continue ASA and Plavix.   3. Hypertension:  Controlled. 4. Hyperlipidemia:  With advanced age, lipids acceptable.  Continue statin. 5. DM with significant peripheral neuropathy. 6. Disposition: Follow up in one year.

## 2014-07-18 ENCOUNTER — Emergency Department (HOSPITAL_COMMUNITY)
Admission: EM | Admit: 2014-07-18 | Discharge: 2014-07-18 | Disposition: A | Discharge status: Home / Self Care | Payer: Medicare PPO | Attending: Emergency Medicine | Admitting: Emergency Medicine

## 2014-07-18 ENCOUNTER — Emergency Department (HOSPITAL_COMMUNITY): Payer: Medicare PPO

## 2014-07-18 ENCOUNTER — Encounter (HOSPITAL_COMMUNITY): Payer: Self-pay | Admitting: Emergency Medicine

## 2014-07-18 DIAGNOSIS — I2510 Atherosclerotic heart disease of native coronary artery without angina pectoris: Secondary | ICD-10-CM | POA: Insufficient documentation

## 2014-07-18 DIAGNOSIS — Z79899 Other long term (current) drug therapy: Secondary | ICD-10-CM | POA: Insufficient documentation

## 2014-07-18 DIAGNOSIS — E785 Hyperlipidemia, unspecified: Secondary | ICD-10-CM | POA: Diagnosis not present

## 2014-07-18 DIAGNOSIS — W19XXXA Unspecified fall, initial encounter: Secondary | ICD-10-CM

## 2014-07-18 DIAGNOSIS — S0083XA Contusion of other part of head, initial encounter: Secondary | ICD-10-CM | POA: Diagnosis not present

## 2014-07-18 DIAGNOSIS — R296 Repeated falls: Secondary | ICD-10-CM | POA: Diagnosis not present

## 2014-07-18 DIAGNOSIS — Z9861 Coronary angioplasty status: Secondary | ICD-10-CM | POA: Diagnosis not present

## 2014-07-18 DIAGNOSIS — Z7902 Long term (current) use of antithrombotics/antiplatelets: Secondary | ICD-10-CM | POA: Insufficient documentation

## 2014-07-18 DIAGNOSIS — Z7982 Long term (current) use of aspirin: Secondary | ICD-10-CM | POA: Diagnosis not present

## 2014-07-18 DIAGNOSIS — Y9389 Activity, other specified: Secondary | ICD-10-CM | POA: Insufficient documentation

## 2014-07-18 DIAGNOSIS — Z8742 Personal history of other diseases of the female genital tract: Secondary | ICD-10-CM | POA: Diagnosis not present

## 2014-07-18 DIAGNOSIS — H9190 Unspecified hearing loss, unspecified ear: Secondary | ICD-10-CM | POA: Diagnosis not present

## 2014-07-18 DIAGNOSIS — Z8619 Personal history of other infectious and parasitic diseases: Secondary | ICD-10-CM | POA: Insufficient documentation

## 2014-07-18 DIAGNOSIS — S0003XA Contusion of scalp, initial encounter: Secondary | ICD-10-CM | POA: Insufficient documentation

## 2014-07-18 DIAGNOSIS — S0990XA Unspecified injury of head, initial encounter: Secondary | ICD-10-CM | POA: Diagnosis present

## 2014-07-18 DIAGNOSIS — E119 Type 2 diabetes mellitus without complications: Secondary | ICD-10-CM | POA: Diagnosis not present

## 2014-07-18 DIAGNOSIS — S1093XA Contusion of unspecified part of neck, initial encounter: Principal | ICD-10-CM

## 2014-07-18 DIAGNOSIS — M129 Arthropathy, unspecified: Secondary | ICD-10-CM | POA: Insufficient documentation

## 2014-07-18 DIAGNOSIS — Y929 Unspecified place or not applicable: Secondary | ICD-10-CM | POA: Diagnosis not present

## 2014-07-18 DIAGNOSIS — I10 Essential (primary) hypertension: Secondary | ICD-10-CM | POA: Insufficient documentation

## 2014-07-18 DIAGNOSIS — Z8673 Personal history of transient ischemic attack (TIA), and cerebral infarction without residual deficits: Secondary | ICD-10-CM | POA: Diagnosis not present

## 2014-07-18 DIAGNOSIS — Z87891 Personal history of nicotine dependence: Secondary | ICD-10-CM | POA: Insufficient documentation

## 2014-07-18 MED ORDER — FENTANYL CITRATE 0.05 MG/ML IJ SOLN
50.0000 ug | Freq: Once | INTRAMUSCULAR | Status: AC
  Administered 2014-07-18: 50 ug via INTRAMUSCULAR
  Filled 2014-07-18: qty 2

## 2014-07-18 NOTE — ED Notes (Signed)
[facility]

## 2014-07-18 NOTE — ED Provider Notes (Signed)
CSN: [id number]     Arrival date & time 07/18/14  1928 History   First MD Initiated Contact with Patient 07/18/14 1954     Chief Complaint  Patient presents with  . Fall     (Consider location/radiation/quality/duration/timing/severity/associated sxs/prior Treatment) HPI Comments: Patient presents to the emergency department with chief complaint of fall. She states that she was bending over to feed her cat, when she lost her balance. She states that she normally walks with a walker, but does lose her balance from time to time. She attributes this to neuropathy in her legs. Currently, she denies any pain, but states that she didn't hit her head when she fell. There is no bleeding. She states that she takes Plavix. She denies any weakness in her arms or legs. She has not taken anything to alleviate her symptoms.  The history is provided by the patient. No language interpreter was used.    Past Medical History  Diagnosis Date  . CAD (coronary artery disease)     a. 2004 Cath/PCI with stenting x 1 in [place].;  Lexiscan Myoview 6/14:normal study, no ischemia, EF 79%  . Hypertension   . Hyperlipemia   . History of tobacco abuse     a. 50 pack yr hx, quit 2004  . History of shingles     a. left leg with residual pain.  . Peripheral neuropathy   . Hard of hearing   . Fibrous breast lumps     "both sides; I did not have cancer" (05/13/2013)  . Family history of anesthesia complication     "took a long time to wake my mother up after one of her surgeries" (05/13/2013)  . Macular degeneration, wet     "both eyes" (05/13/2013)  . Type II diabetes mellitus   . Arthritis     "hands; maybe feet" (05/13/2013)  . TIA (transient ischemic attack)   . Peripheral neuropathy 05/23/2014   Past Surgical History  Procedure Laterality Date  . Tonsillectomy    . Appendectomy    . Carpal tunnel release Bilateral   . Mastectomy Bilateral 1980's    "multiple fibrous cysts; never had cancer" (05/13/2013)20   . Cesarean section  1960; 1962  . Inner ear surgery Left 1962    "put a piece of steel and a tube & a vein from my ?left hand in" (05/13/2013)  . Cataract extraction w/ intraocular lens  implant, bilateral Bilateral 2000  . Breast biopsy    . Tubal ligation  1963  . Coronary angioplasty with stent placement  2004    "1" (05/13/2013)   Family History  Problem Relation Age of Onset  . Stroke Father     deceased  . Other Mother     deceased - possibly renal failure   History  Substance Use Topics  . Smoking status: Former Smoker -- 1.00 packs/day for 50 years    Types: Cigarettes    Quit date: 05/12/2003  . Smokeless tobacco: Never Used  . Alcohol Use: Yes     Comment: 05/13/2013 "socially; very seldom anymore"   OB History   Grav Para Term Preterm Abortions TAB SAB Ect Mult Living                 Review of Systems  Constitutional: Negative for fever and chills.  Respiratory: Negative for shortness of breath.   Cardiovascular: Negative for chest pain.  Gastrointestinal: Negative for nausea, vomiting, diarrhea and constipation.  Genitourinary: Negative for dysuria.  All other  systems reviewed and are negative.     Allergies  Review of patient's allergies indicates no known allergies.  Home Medications   Prior to Admission medications   Medication Sig Start Date End Date Taking? Authorizing Provider  acetaminophen (TYLENOL) 500 MG tablet Take 500 mg by mouth 2 (two) times daily.   Yes Historical Provider, MD  aspirin 325 MG buffered tablet Take 325 mg by mouth daily.   Yes Historical Provider, MD  bimatoprost (LUMIGAN) 0.01 % SOLN Place 2 drops into both eyes daily.    Yes Historical Provider, MD  calcium-vitamin D (OSCAL WITH D) 500-200 MG-UNIT per tablet Take 1 tablet by mouth daily.   Yes Historical Provider, MD  capsicum oleoresin (TRIXAICIN) 0.025 % cream Apply 1 application topically 2 (two) times daily as needed (for rash).    Yes Historical Provider, MD   cloNIDine (CATAPRES) 0.1 MG tablet Take 0.1 mg by mouth 2 (two) times daily.   Yes Historical Provider, MD  clopidogrel (PLAVIX) 75 MG tablet Take 1 tablet (75 mg total) by mouth daily with breakfast. 05/14/13  Yes Rhetta Mura, MD  docusate sodium (COLACE) 100 MG capsule Take 100 mg by mouth 2 (two) times daily.   Yes Historical Provider, MD  guaiFENesin (MUCINEX) 600 MG 12 hr tablet Take 600 mg by mouth 2 (two) times daily.   Yes Historical Provider, MD  ibandronate (BONIVA) 150 MG tablet Take 150 mg by mouth every 30 (thirty) days. Take in the morning with a full glass of water, on an empty stomach, and do not take anything else by mouth or lie down for the next 30 min.   Yes Historical Provider, MD  isosorbide mononitrate (IMDUR) 30 MG 24 hr tablet Take 1 tablet (30 mg total) by mouth daily. 05/11/13  Yes Zannie Cove, MD  lisinopril (PRINIVIL,ZESTRIL) 5 MG tablet Take 5 mg by mouth daily.   Yes Historical Provider, MD  metFORMIN (GLUCOPHAGE) 1000 MG tablet Take 1,000 mg by mouth daily with breakfast.   Yes Historical Provider, MD  Multiple Vitamins-Minerals (I-VITE PO) Take 1 tablet by mouth daily.   Yes Historical Provider, MD  nebivolol (BYSTOLIC) 5 MG tablet Take 5 mg by mouth daily.   Yes Historical Provider, MD  polyethylene glycol (MIRALAX / GLYCOLAX) packet Take 17 g by mouth daily as needed for mild constipation.   Yes Historical Provider, MD  polyvinyl alcohol (LIQUIFILM TEARS) 1.4 % ophthalmic solution Place 1 drop into both eyes 6 (six) times daily.   Yes Historical Provider, MD  pregabalin (LYRICA) 150 MG capsule Take 100 mg by mouth 2 (two) times daily.    Yes Historical Provider, MD  psyllium (REGULOID) 0.52 G capsule Take 0.52 g by mouth daily.   Yes Historical Provider, MD  simvastatin (ZOCOR) 10 MG tablet Take 10 mg by mouth at bedtime.   Yes Historical Provider, MD   BP 217/73  Pulse 67  Temp(Src) 98.1 F (36.7 C) (Oral)  Resp 17  Wt 175 lb (79.379 kg)  SpO2  95% Physical Exam  Nursing note and vitals reviewed. Constitutional: She is oriented to person, place, and time. She appears well-developed and well-nourished.  HENT:  Head: Normocephalic and atraumatic.  2 x 2 centimeter hematoma to the crown of the head, no laceration or bleeding  Eyes: Conjunctivae and EOM are normal. Pupils are equal, round, and reactive to light.  Neck: Normal range of motion. Neck supple.  Cardiovascular: Normal rate and regular rhythm.  Exam reveals no gallop and  no friction rub.   No murmur heard. Pulmonary/Chest: Effort normal and breath sounds normal. No respiratory distress. She has no wheezes. She has no rales. She exhibits no tenderness.  Abdominal: Soft. Bowel sounds are normal. She exhibits no distension and no mass. There is no tenderness. There is no rebound and no guarding.  Musculoskeletal: Normal range of motion. She exhibits no edema and no tenderness.  Moves all extremities  Neurological: She is alert and oriented to person, place, and time.  CN 3-12 intact, speech is clear, movements are goal oriented  Skin: Skin is warm and dry.  Psychiatric: She has a normal mood and affect. Her behavior is normal. Judgment and thought content normal.    ED Course  Procedures (including critical care time) Labs Review Labs Reviewed - No data to display  Imaging Review Ct Head Wo Contrast  07/18/2014   CLINICAL DATA:  Fall.  Head injury  EXAM: CT HEAD WITHOUT CONTRAST  CT CERVICAL SPINE WITHOUT CONTRAST  TECHNIQUE: Multidetector CT imaging of the head and cervical spine was performed following the standard protocol without intravenous contrast. Multiplanar CT image reconstructions of the cervical spine were also generated.  COMPARISON:  CT head 11/15/2013  FINDINGS: CT HEAD FINDINGS  Generalized atrophy. Chronic microvascular ischemic change in the white matter. Chronic lacunar infarction in the right internal capsule anteriorly. Chronic infarct in the right  anterior thalamus also unchanged.  Negative for acute infarct. Negative for hemorrhage or mass. Calvarium intact. Atherosclerotic disease.  CT CERVICAL SPINE FINDINGS  Multilevel cervical disc degeneration and spurring. Multilevel facet hypertrophy. Multilevel spinal stenosis secondary to degenerative changes C4-5, C5-6, C6-7. 2 mm anterior slip C3-4 felt to be degenerative  Negative for cervical spine fracture.  IMPRESSION: No acute abnormality in the head or cervical spine.   Electronically Signed   By: Marlan Palau M.D.   On: 07/18/2014 21:44   Ct Cervical Spine Wo Contrast  07/18/2014   CLINICAL DATA:  Fall.  Head injury  EXAM: CT HEAD WITHOUT CONTRAST  CT CERVICAL SPINE WITHOUT CONTRAST  TECHNIQUE: Multidetector CT imaging of the head and cervical spine was performed following the standard protocol without intravenous contrast. Multiplanar CT image reconstructions of the cervical spine were also generated.  COMPARISON:  CT head 11/15/2013  FINDINGS: CT HEAD FINDINGS  Generalized atrophy. Chronic microvascular ischemic change in the white matter. Chronic lacunar infarction in the right internal capsule anteriorly. Chronic infarct in the right anterior thalamus also unchanged.  Negative for acute infarct. Negative for hemorrhage or mass. Calvarium intact. Atherosclerotic disease.  CT CERVICAL SPINE FINDINGS  Multilevel cervical disc degeneration and spurring. Multilevel facet hypertrophy. Multilevel spinal stenosis secondary to degenerative changes C4-5, C5-6, C6-7. 2 mm anterior slip C3-4 felt to be degenerative  Negative for cervical spine fracture.  IMPRESSION: No acute abnormality in the head or cervical spine.   Electronically Signed   By: Marlan Palau M.D.   On: 07/18/2014 21:44     EKG Interpretation None      MDM   Final diagnoses:  Fall, initial encounter    Patient with mechanical fall, after losing her balance today. CT head and cervical spine are negative. Patient seen by and  discussed with Dr. Rennis Chris, who agrees the patient can be discharged to home at this time. She is stable and ready for discharge.    Roxy Horseman, PA-C 07/18/14 [id number]

## 2014-07-18 NOTE — ED Provider Notes (Addendum)
Patient with reported ground-level fall today .Patient alert denies pain anywhere. On exam no distress follows commands well states nothing hurts. Motor strength 5 over 5 overall cranial nerves II through XII grossly intact.  Doug SouSam Keilly Fatula, MD 07/18/14 [id number]  Doug SouSam Lauree Yurick, MD 07/19/14 [phone]

## 2014-07-18 NOTE — ED Notes (Signed)
[facility] informed of transport.

## 2014-07-18 NOTE — ED Notes (Signed)
Pt d/c home by Anderson Maltajessica M. RN, [facility]

## 2014-07-18 NOTE — Discharge Instructions (Signed)

## 2014-07-18 NOTE — ED Notes (Signed)
Pt arrived via ems, s/p fall from standing position. Pt was bending over to feed cat and lost balance. Hematoma noted to left occipital region. Alert x4. Denies loc of syncopal episode. Pt takes plavix.

## 2014-07-19 NOTE — ED Provider Notes (Signed)
Medical screening examination/treatment/procedure(s) were conducted as a shared visit with non-physician practitioner(s) and myself.  I personally evaluated the patient during the encounter.   EKG Interpretation None       Doug SouSam Khyron Garno, MD 07/19/14 [id number]

## 2014-09-14 IMAGING — CR DG CHEST 2V
2 series · 2 of 2 positions shown · non-contrast
Comparison: Chest radiograph 05/10/2013

CLINICAL DATA: Chest pain

CHEST - 2 VIEW

[w chest pa]
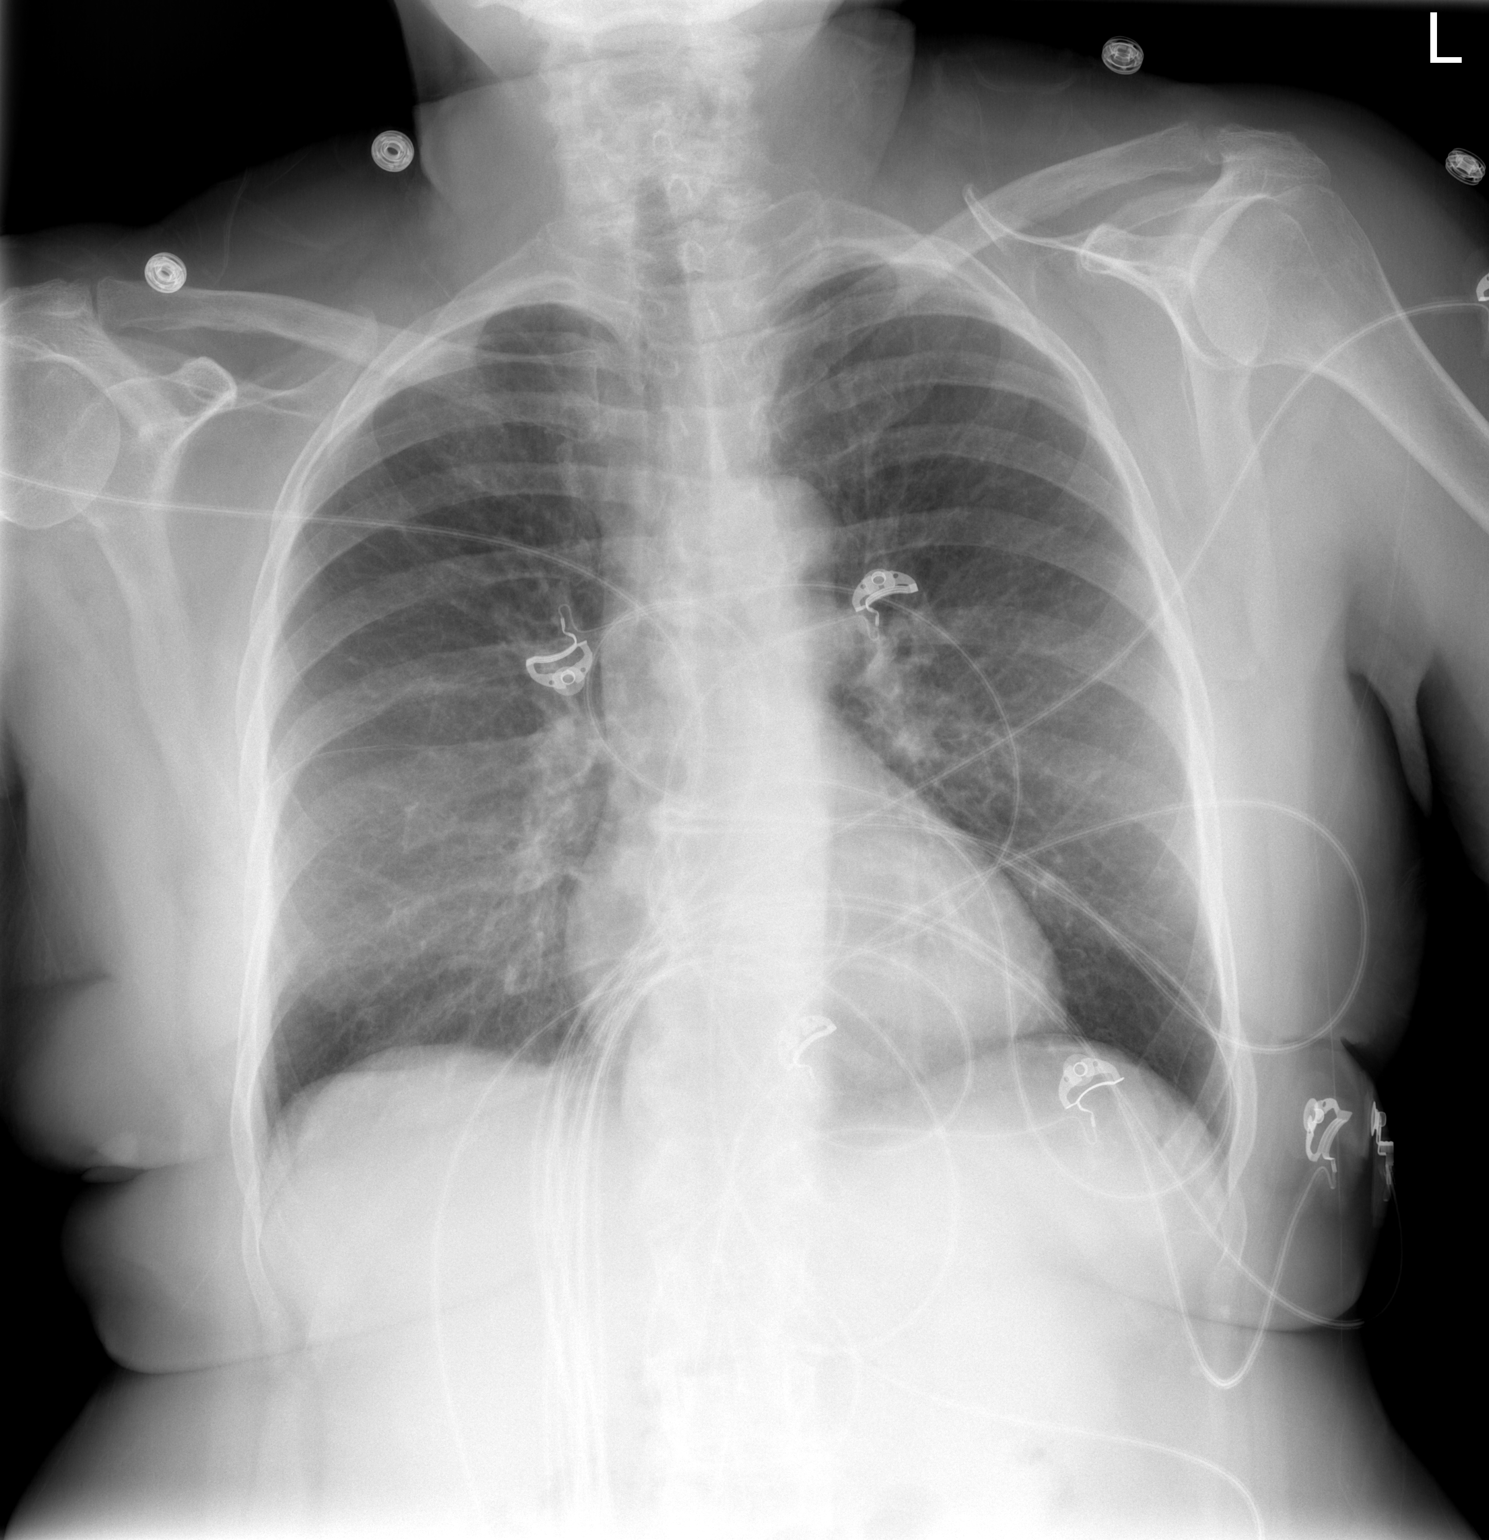

[w chest lat]
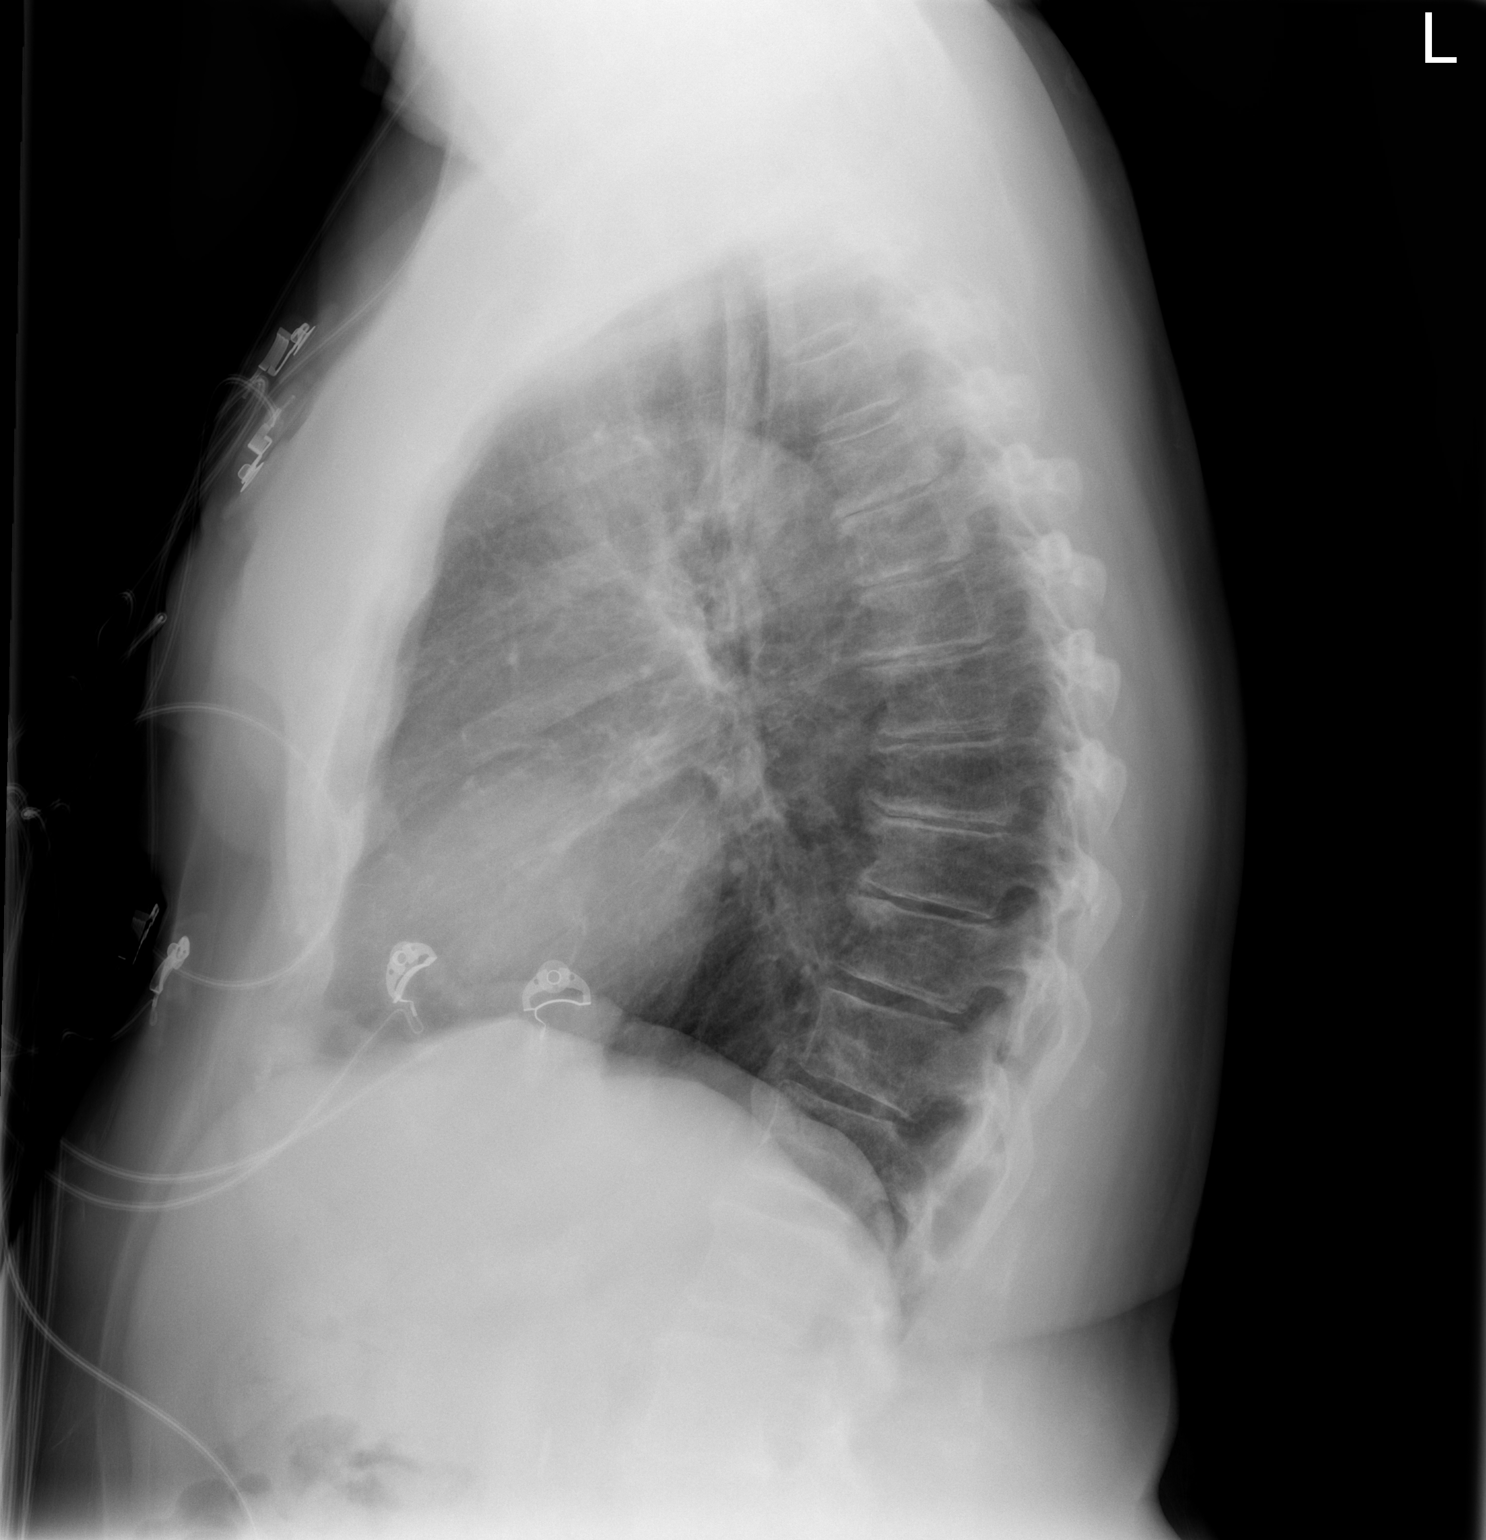

[2 of 2 positions shown; findings below may reference images not displayed]

FINDINGS: Normal mediastinum and heart silhouette.  Lungs are
clear. Degenerative osteophytosis of the thoracic spine.. Mild
chronic bronchitic markings centrally.
IMPRESSION: No interval change.  Mild chronic bronchitic markings.

## 2014-11-12 ENCOUNTER — Encounter (HOSPITAL_COMMUNITY): Payer: Self-pay

## 2014-11-12 ENCOUNTER — Emergency Department (HOSPITAL_COMMUNITY)
Admission: EM | Admit: 2014-11-12 | Discharge: 2014-11-12 | Disposition: A | Discharge status: Home / Self Care | Payer: Medicare PPO | Attending: Emergency Medicine | Admitting: Emergency Medicine

## 2014-11-12 DIAGNOSIS — K6289 Other specified diseases of anus and rectum: Secondary | ICD-10-CM | POA: Diagnosis not present

## 2014-11-12 DIAGNOSIS — Z8673 Personal history of transient ischemic attack (TIA), and cerebral infarction without residual deficits: Secondary | ICD-10-CM | POA: Insufficient documentation

## 2014-11-12 DIAGNOSIS — K921 Melena: Secondary | ICD-10-CM | POA: Insufficient documentation

## 2014-11-12 DIAGNOSIS — I10 Essential (primary) hypertension: Secondary | ICD-10-CM | POA: Insufficient documentation

## 2014-11-12 DIAGNOSIS — Z79899 Other long term (current) drug therapy: Secondary | ICD-10-CM | POA: Diagnosis not present

## 2014-11-12 DIAGNOSIS — Z87891 Personal history of nicotine dependence: Secondary | ICD-10-CM | POA: Diagnosis not present

## 2014-11-12 DIAGNOSIS — I2510 Atherosclerotic heart disease of native coronary artery without angina pectoris: Secondary | ICD-10-CM | POA: Insufficient documentation

## 2014-11-12 DIAGNOSIS — Z7982 Long term (current) use of aspirin: Secondary | ICD-10-CM | POA: Diagnosis not present

## 2014-11-12 DIAGNOSIS — H9190 Unspecified hearing loss, unspecified ear: Secondary | ICD-10-CM | POA: Diagnosis not present

## 2014-11-12 DIAGNOSIS — Z955 Presence of coronary angioplasty implant and graft: Secondary | ICD-10-CM | POA: Insufficient documentation

## 2014-11-12 DIAGNOSIS — E119 Type 2 diabetes mellitus without complications: Secondary | ICD-10-CM | POA: Insufficient documentation

## 2014-11-12 DIAGNOSIS — E785 Hyperlipidemia, unspecified: Secondary | ICD-10-CM | POA: Diagnosis not present

## 2014-11-12 DIAGNOSIS — Z8619 Personal history of other infectious and parasitic diseases: Secondary | ICD-10-CM | POA: Insufficient documentation

## 2014-11-12 DIAGNOSIS — G629 Polyneuropathy, unspecified: Secondary | ICD-10-CM | POA: Diagnosis not present

## 2014-11-12 DIAGNOSIS — M1990 Unspecified osteoarthritis, unspecified site: Secondary | ICD-10-CM | POA: Insufficient documentation

## 2014-11-12 DIAGNOSIS — Z7902 Long term (current) use of antithrombotics/antiplatelets: Secondary | ICD-10-CM | POA: Diagnosis not present

## 2014-11-12 DIAGNOSIS — Z9851 Tubal ligation status: Secondary | ICD-10-CM | POA: Insufficient documentation

## 2014-11-12 DIAGNOSIS — K5900 Constipation, unspecified: Secondary | ICD-10-CM | POA: Diagnosis present

## 2014-11-12 LAB — I-STAT CHEM 8, ED
BUN: 25 mg/dL — ABNORMAL HIGH (ref 6–23)
CALCIUM ION: 1.22 mmol/L (ref 1.13–1.30)
Chloride: 105 mEq/L (ref 96–112)
Creatinine, Ser: 1.1 mg/dL (ref 0.50–1.10)
Glucose, Bld: 154 mg/dL — ABNORMAL HIGH (ref 70–99)
HEMATOCRIT: 40 % (ref 36.0–46.0)
Hemoglobin: 13.6 g/dL (ref 12.0–15.0)
Potassium: 4.6 mEq/L (ref 3.7–5.3)
Sodium: 141 mEq/L (ref 137–147)
TCO2: 28 mmol/L (ref 0–100)

## 2014-11-12 LAB — POC OCCULT BLOOD, ED: Fecal Occult Bld: NEGATIVE

## 2014-11-12 MED ORDER — ZINC OXIDE 12.8 % EX OINT: 1.0000 "application " | TOPICAL_OINTMENT | CUTANEOUS | Status: DC | PRN

## 2014-11-12 NOTE — ED Notes (Signed)
Per EMS, Patient was coming from [facility]. Patient had a bowel movement today, but prior she had not had a bowel movement for ten days. Patient noted blood in her stool today. Patient complains of abdominal pain. Patient had been given Miralax at the facility to move stools. Vitals per EMS: 150/80, 70 HR, 171 CBG. History of HTN, Hyperlipidemia, Diabetic Neuropathy.

## 2014-11-12 NOTE — ED Provider Notes (Signed)
CSN: [id number]     Arrival date & time 11/12/14  2018 History   First MD Initiated Contact with Patient 11/12/14 2020     Chief Complaint  Patient presents with  . Constipation     (Consider location/radiation/quality/duration/timing/severity/associated sxs/prior Treatment) Patient is a 80 y.o. female presenting with constipation.  Constipation Severity:  Moderate Time since last bowel movement:  10 days Progression:  Resolved Chronicity:  Chronic Context: not dehydration, not dietary changes, not medication and not narcotics   Relieved by:  None tried Worsened by:  Nothing tried Ineffective treatments:  None tried Associated symptoms: no abdominal pain, no fever, no nausea and no vomiting     Past Medical History  Diagnosis Date  . CAD (coronary artery disease)     a. 2004 Cath/PCI with stenting x 1 in [place].;  Lexiscan Myoview 6/14:normal study, no ischemia, EF 79%  . Hypertension   . Hyperlipemia   . History of tobacco abuse     a. 50 pack yr hx, quit 2004  . History of shingles     a. left leg with residual pain.  . Peripheral neuropathy   . Hard of hearing   . Fibrous breast lumps     "both sides; I did not have cancer" (05/13/2013)  . Family history of anesthesia complication     "took a long time to wake my mother up after one of her surgeries" (05/13/2013)  . Macular degeneration, wet     "both eyes" (05/13/2013)  . Type II diabetes mellitus   . Arthritis     "hands; maybe feet" (05/13/2013)  . TIA (transient ischemic attack)   . Peripheral neuropathy 05/23/2014   Past Surgical History  Procedure Laterality Date  . Tonsillectomy    . Appendectomy    . Carpal tunnel release Bilateral   . Mastectomy Bilateral 1980's    "multiple fibrous cysts; never had cancer" (05/13/2013)20  . Cesarean section  1960; 1962  . Inner ear surgery Left 1962    "put a piece of steel and a tube & a vein from my ?left hand in" (05/13/2013)  . Cataract extraction w/ intraocular  lens  implant, bilateral Bilateral 2000  . Breast biopsy    . Tubal ligation  1963  . Coronary angioplasty with stent placement  2004    "1" (05/13/2013)   Family History  Problem Relation Age of Onset  . Stroke Father     deceased  . Other Mother     deceased - possibly renal failure   History  Substance Use Topics  . Smoking status: Former Smoker -- 1.00 packs/day for 50 years    Types: Cigarettes    Quit date: 05/12/2003  . Smokeless tobacco: Never Used  . Alcohol Use: Yes     Comment: 05/13/2013 "socially; very seldom anymore"   OB History    No data available     Review of Systems  Constitutional: Negative for fever and activity change.  Eyes: Negative for photophobia and pain.  Respiratory: Negative for cough and shortness of breath.   Cardiovascular: Negative for chest pain and leg swelling.  Gastrointestinal: Positive for constipation, blood in stool and rectal pain. Negative for nausea, vomiting and abdominal pain.  Endocrine: Negative for polydipsia and polyuria.  All other systems reviewed and are negative.     Allergies  Review of patient's allergies indicates no known allergies.  Home Medications   Prior to Admission medications   Medication Sig Start Date End Date Taking?  Authorizing Provider  acetaminophen (TYLENOL) 500 MG tablet Take 500 mg by mouth 3 (three) times daily.    Yes Historical Provider, MD  aspirin 325 MG buffered tablet Take 325 mg by mouth daily.   Yes Historical Provider, MD  bimatoprost (LUMIGAN) 0.01 % SOLN Place 2 drops into both eyes daily.    Yes Historical Provider, MD  Calcium Carb-Cholecalciferol (OYSTER SHELL CALCIUM + D) 500-400 MG-UNIT TABS Take 1 tablet by mouth daily.   Yes Historical Provider, MD  capsicum oleoresin (TRIXAICIN) 0.025 % cream Apply 1 application topically 2 (two) times daily as needed (for rash).    Yes Historical Provider, MD  cloNIDine (CATAPRES) 0.1 MG tablet Take 0.1 mg by mouth 2 (two) times daily.    Yes Historical Provider, MD  clopidogrel (PLAVIX) 75 MG tablet Take 1 tablet (75 mg total) by mouth daily with breakfast. 05/14/13  Yes Rhetta Mura, MD  docusate sodium (COLACE) 100 MG capsule Take 200 mg by mouth at bedtime.    Yes Historical Provider, MD  guaiFENesin (MUCINEX) 600 MG 12 hr tablet Take 600 mg by mouth 2 (two) times daily.   Yes Historical Provider, MD  ibandronate (BONIVA) 150 MG tablet Take 150 mg by mouth every 30 (thirty) days. Take in the morning with a full glass of water, on an empty stomach, and do not take anything else by mouth or lie down for the next 30 min.   Yes Historical Provider, MD  isosorbide mononitrate (IMDUR) 30 MG 24 hr tablet Take 1 tablet (30 mg total) by mouth daily. 05/11/13  Yes Zannie Cove, MD  lisinopril (PRINIVIL,ZESTRIL) 5 MG tablet Take 5 mg by mouth daily.   Yes Historical Provider, MD  metFORMIN (GLUCOPHAGE) 1000 MG tablet Take 1,000 mg by mouth daily with breakfast.   Yes Historical Provider, MD  Multiple Vitamins-Minerals (I-VITE PO) Take 1 tablet by mouth daily.   Yes Historical Provider, MD  nebivolol (BYSTOLIC) 5 MG tablet Take 5 mg by mouth daily.   Yes Historical Provider, MD  Polyethyl Glycol-Propyl Glycol (SYSTANE ULTRA OP) Place 2 drops into both eyes at bedtime.   Yes Historical Provider, MD  polyethylene glycol (MIRALAX / GLYCOLAX) packet Take 17 g by mouth daily as needed for mild constipation.   Yes Historical Provider, MD  pregabalin (LYRICA) 150 MG capsule Take 150 mg by mouth 2 (two) times daily.    Yes Historical Provider, MD  psyllium (REGULOID) 0.52 G capsule Take 0.52 g by mouth daily.   Yes Historical Provider, MD  simvastatin (ZOCOR) 10 MG tablet Take 15 mg by mouth at bedtime.    Yes Historical Provider, MD  calcium-vitamin D (OSCAL WITH D) 500-200 MG-UNIT per tablet Take 1 tablet by mouth daily.    Historical Provider, MD  polyvinyl alcohol (LIQUIFILM TEARS) 1.4 % ophthalmic solution Place 1 drop into both eyes 6  (six) times daily.    Historical Provider, MD  Zinc Oxide (TRIPLE PASTE) 12.8 % ointment Apply 1 application topically as needed for irritation. 11/12/14   Barbara Cower Abbegail Matuska, MD   BP 165/69 mmHg  Pulse 66  Temp(Src) 98.5 F (36.9 C) (Oral)  Resp 16  SpO2 97% Physical Exam  Constitutional: She is oriented to person, place, and time. She appears well-developed and well-nourished.  HENT:  Head: Normocephalic and atraumatic.  Eyes: Pupils are equal, round, and reactive to light.  Neck: Normal range of motion.  Cardiovascular: Normal rate and regular rhythm.   Pulmonary/Chest: Effort normal and breath sounds normal.  Abdominal: Soft. She exhibits no distension. There is no tenderness.  Genitourinary: Rectal exam shows no external hemorrhoid, no internal hemorrhoid, no fissure, no mass, no tenderness and anal tone normal. Guaiac negative stool.     Irritation around rectum with some open skin  Musculoskeletal: Normal range of motion. She exhibits no edema or tenderness.  Neurological: She is alert and oriented to person, place, and time.  Skin: Skin is warm and dry. Rash (around rectum as shown above) noted.  Nursing note and vitals reviewed.   ED Course  Procedures (including critical care time) Labs Review Labs Reviewed  I-STAT CHEM 8, ED - Abnormal; Notable for the following:    BUN 25 (*)    Glucose, Bld 154 (*)    All other components within normal limits  POC OCCULT BLOOD, ED    Imaging Review No results found.   EKG Interpretation None      MDM   Final diagnoses:  Perirectal skin irritation    80 yo F w/ constipation for 9-10 days, h/o same, BM today but had some perirectal pain with it and spotting on the tissue afterwards. No pain since then. Exam with significant skin irritation around rectum, no evidence of abscess or cellulitis. Not c/w yeast infection. Will rx for triple paste for intertrigo and pcp fu. Unsure of cause of constipation, on good bowel regimen at  home, had BM today, normal vitals normal labs, no fever or abdominal pain doubt obstruction. Will allow tocontinue home bowel regimen.     Marily MemosJason Ivyonna Hoelzel, MD 11/13/14 [id number]  Derwood KaplanAnkit Nanavati, MD 11/13/14 1601

## 2015-02-21 ENCOUNTER — Emergency Department (HOSPITAL_COMMUNITY): Payer: Medicare PPO

## 2015-02-21 ENCOUNTER — Emergency Department (HOSPITAL_COMMUNITY)
Admission: EM | Admit: 2015-02-21 | Discharge: 2015-02-21 | Disposition: A | Discharge status: Home / Self Care | Payer: Medicare PPO | Attending: Emergency Medicine | Admitting: Emergency Medicine

## 2015-02-21 ENCOUNTER — Encounter (HOSPITAL_COMMUNITY): Payer: Self-pay | Admitting: Emergency Medicine

## 2015-02-21 DIAGNOSIS — S0083XA Contusion of other part of head, initial encounter: Secondary | ICD-10-CM | POA: Diagnosis not present

## 2015-02-21 DIAGNOSIS — E785 Hyperlipidemia, unspecified: Secondary | ICD-10-CM | POA: Insufficient documentation

## 2015-02-21 DIAGNOSIS — Y998 Other external cause status: Secondary | ICD-10-CM | POA: Diagnosis not present

## 2015-02-21 DIAGNOSIS — Y9389 Activity, other specified: Secondary | ICD-10-CM | POA: Diagnosis not present

## 2015-02-21 DIAGNOSIS — I159 Secondary hypertension, unspecified: Secondary | ICD-10-CM | POA: Insufficient documentation

## 2015-02-21 DIAGNOSIS — Z87891 Personal history of nicotine dependence: Secondary | ICD-10-CM | POA: Diagnosis not present

## 2015-02-21 DIAGNOSIS — I10 Essential (primary) hypertension: Secondary | ICD-10-CM | POA: Diagnosis not present

## 2015-02-21 DIAGNOSIS — Z7982 Long term (current) use of aspirin: Secondary | ICD-10-CM | POA: Insufficient documentation

## 2015-02-21 DIAGNOSIS — S0990XA Unspecified injury of head, initial encounter: Secondary | ICD-10-CM | POA: Diagnosis present

## 2015-02-21 DIAGNOSIS — M1990 Unspecified osteoarthritis, unspecified site: Secondary | ICD-10-CM | POA: Diagnosis not present

## 2015-02-21 DIAGNOSIS — Y9289 Other specified places as the place of occurrence of the external cause: Secondary | ICD-10-CM | POA: Insufficient documentation

## 2015-02-21 DIAGNOSIS — W19XXXA Unspecified fall, initial encounter: Secondary | ICD-10-CM | POA: Insufficient documentation

## 2015-02-21 DIAGNOSIS — I2510 Atherosclerotic heart disease of native coronary artery without angina pectoris: Secondary | ICD-10-CM | POA: Insufficient documentation

## 2015-02-21 DIAGNOSIS — E119 Type 2 diabetes mellitus without complications: Secondary | ICD-10-CM | POA: Insufficient documentation

## 2015-02-21 DIAGNOSIS — Z7902 Long term (current) use of antithrombotics/antiplatelets: Secondary | ICD-10-CM | POA: Insufficient documentation

## 2015-02-21 DIAGNOSIS — Z8673 Personal history of transient ischemic attack (TIA), and cerebral infarction without residual deficits: Secondary | ICD-10-CM | POA: Insufficient documentation

## 2015-02-21 DIAGNOSIS — Z8669 Personal history of other diseases of the nervous system and sense organs: Secondary | ICD-10-CM | POA: Diagnosis not present

## 2015-02-21 DIAGNOSIS — Z79899 Other long term (current) drug therapy: Secondary | ICD-10-CM | POA: Diagnosis not present

## 2015-02-21 LAB — RAPID URINE DRUG SCREEN, HOSP PERFORMED
Amphetamines: NOT DETECTED
BARBITURATES: NOT DETECTED
Benzodiazepines: NOT DETECTED
Cocaine: NOT DETECTED
Opiates: NOT DETECTED
Tetrahydrocannabinol: NOT DETECTED

## 2015-02-21 LAB — I-STAT CHEM 8, ED
BUN: 24 mg/dL — ABNORMAL HIGH (ref 6–23)
CALCIUM ION: 1.23 mmol/L (ref 1.13–1.30)
CHLORIDE: 104 mmol/L (ref 96–112)
CREATININE: 1 mg/dL (ref 0.50–1.10)
GLUCOSE: 125 mg/dL — AB (ref 70–99)
HCT: 43 % (ref 36.0–46.0)
Hemoglobin: 14.6 g/dL (ref 12.0–15.0)
Potassium: 4.2 mmol/L (ref 3.5–5.1)
Sodium: 142 mmol/L (ref 135–145)
TCO2: 23 mmol/L (ref 0–100)

## 2015-02-21 LAB — URINALYSIS, ROUTINE W REFLEX MICROSCOPIC
Bilirubin Urine: NEGATIVE
GLUCOSE, UA: NEGATIVE mg/dL
Hgb urine dipstick: NEGATIVE
Ketones, ur: NEGATIVE mg/dL
LEUKOCYTES UA: NEGATIVE
Nitrite: NEGATIVE
PH: 6.5 (ref 5.0–8.0)
Protein, ur: NEGATIVE mg/dL
Specific Gravity, Urine: 1.008 (ref 1.005–1.030)
Urobilinogen, UA: 0.2 mg/dL (ref 0.0–1.0)

## 2015-02-21 LAB — I-STAT TROPONIN, ED: Troponin i, poc: 0 ng/mL (ref 0.00–0.08)

## 2015-02-21 LAB — CBC WITH DIFFERENTIAL/PLATELET
BASOS ABS: 0.1 10*3/uL (ref 0.0–0.1)
Basophils Relative: 2 % — ABNORMAL HIGH (ref 0–1)
EOS PCT: 6 % — AB (ref 0–5)
Eosinophils Absolute: 0.3 10*3/uL (ref 0.0–0.7)
HEMATOCRIT: 40.6 % (ref 36.0–46.0)
Hemoglobin: 13.3 g/dL (ref 12.0–15.0)
LYMPHS ABS: 2.3 10*3/uL (ref 0.7–4.0)
LYMPHS PCT: 43 % (ref 12–46)
MCH: 31.1 pg (ref 26.0–34.0)
MCHC: 32.8 g/dL (ref 30.0–36.0)
MCV: 94.9 fL (ref 78.0–100.0)
MONO ABS: 0.5 10*3/uL (ref 0.1–1.0)
Monocytes Relative: 9 % (ref 3–12)
Neutro Abs: 2.1 10*3/uL (ref 1.7–7.7)
Neutrophils Relative %: 40 % — ABNORMAL LOW (ref 43–77)
PLATELETS: 243 10*3/uL (ref 150–400)
RBC: 4.28 MIL/uL (ref 3.87–5.11)
RDW: 12.8 % (ref 11.5–15.5)
WBC: 5.3 10*3/uL (ref 4.0–10.5)

## 2015-02-21 LAB — I-STAT CG4 LACTIC ACID, ED: LACTIC ACID, VENOUS: 0.88 mmol/L (ref 0.5–2.0)

## 2015-02-21 LAB — CBG MONITORING, ED: Glucose-Capillary: 105 mg/dL — ABNORMAL HIGH (ref 70–99)

## 2015-02-21 LAB — ETHANOL

## 2015-02-21 LAB — PROTIME-INR
INR: 0.94 (ref 0.00–1.49)
PROTHROMBIN TIME: 12.6 s (ref 11.6–15.2)

## 2015-02-21 MED ORDER — SODIUM CHLORIDE 0.9 % IV BOLUS (SEPSIS)
1000.0000 mL | Freq: Once | INTRAVENOUS | Status: AC
  Administered 2015-02-21: 1000 mL via INTRAVENOUS

## 2015-02-21 MED ORDER — ACETAMINOPHEN 500 MG PO TABS
1000.0000 mg | ORAL_TABLET | Freq: Once | ORAL | Status: AC
  Administered 2015-02-21: 1000 mg via ORAL
  Filled 2015-02-21: qty 2

## 2015-02-21 NOTE — ED Notes (Signed)
[facility] RN available.  Asked to call back at 9:30.

## 2015-02-21 NOTE — ED Notes (Signed)
Call[facility] to give [facility], voicemail and no response from facility.

## 2015-02-21 NOTE — ED Notes (Addendum)
Pt CBG result - 105. Informed RN

## 2015-02-21 NOTE — ED Notes (Signed)
[facility] called with no response from the facility.  Charge RN made aware.

## 2015-02-21 NOTE — ED Provider Notes (Signed)
CSN: [id number]     Arrival date & time 02/21/15  0357 History   First MD Initiated Contact with Patient 02/21/15 0410     Chief Complaint  Patient presents with  . Fall    with hematoma to right side of head     (Consider location/radiation/quality/duration/timing/severity/associated sxs/prior Treatment) HPI  Judy Perez is a 81 y.o. female with past medical history of hypertension, hyperlipidemia, coronary artery disease presenting today after a fall. She has been to this emergency department multiple times for the same complaint. Patient is on Plavix, she was am going to the bathroom and had an unwitnessed fall. There is hematoma to the right side of her head with swelling and bruising. When asked, the patient states her only pain is in her head. The nursing facility states that the patient is more altered than her baseline currently. Patient currently denies further symptoms or recent infections.  10 Systems reviewed and are negative for acute change except as noted in the HPI.    Past Medical History  Diagnosis Date  . CAD (coronary artery disease)     a. 2004 Cath/PCI with stenting x 1 in [place].;  Lexiscan Myoview 6/14:normal study, no ischemia, EF 79%  . Hypertension   . Hyperlipemia   . History of tobacco abuse     a. 50 pack yr hx, quit 2004  . History of shingles     a. left leg with residual pain.  . Peripheral neuropathy   . Hard of hearing   . Fibrous breast lumps     "both sides; I did not have cancer" (05/13/2013)  . Family history of anesthesia complication     "took a long time to wake my mother up after one of her surgeries" (05/13/2013)  . Macular degeneration, wet     "both eyes" (05/13/2013)  . Type II diabetes mellitus   . Arthritis     "hands; maybe feet" (05/13/2013)  . TIA (transient ischemic attack)   . Peripheral neuropathy 05/23/2014   Past Surgical History  Procedure Laterality Date  . Tonsillectomy    . Appendectomy    . Carpal tunnel release  Bilateral   . Mastectomy Bilateral 1980's    "multiple fibrous cysts; never had cancer" (05/13/2013)20  . Cesarean section  1960; 1962  . Inner ear surgery Left 1962    "put a piece of steel and a tube & a vein from my ?left hand in" (05/13/2013)  . Cataract extraction w/ intraocular lens  implant, bilateral Bilateral 2000  . Breast biopsy    . Tubal ligation  1963  . Coronary angioplasty with stent placement  2004    "1" (05/13/2013)   Family History  Problem Relation Age of Onset  . Stroke Father     deceased  . Other Mother     deceased - possibly renal failure   History  Substance Use Topics  . Smoking status: Former Smoker -- 1.00 packs/day for 50 years    Types: Cigarettes    Quit date: 05/12/2003  . Smokeless tobacco: Never Used  . Alcohol Use: Yes     Comment: 05/13/2013 "socially; very seldom anymore"   OB History    No data available     Review of Systems    Allergies  Review of patient's allergies indicates no known allergies.  Home Medications   Prior to Admission medications   Medication Sig Start Date End Date Taking? Authorizing Provider  acetaminophen (TYLENOL) 500 MG tablet Take 500  mg by mouth 3 (three) times daily.    Yes Historical Provider, MD  acetaminophen (TYLENOL) 500 MG tablet Take 500 mg by mouth every 8 (eight) hours as needed for mild pain.   Yes Historical Provider, MD  aspirin 325 MG buffered tablet Take 325 mg by mouth daily.   Yes Historical Provider, MD  bimatoprost (LUMIGAN) 0.01 % SOLN Place 2 drops into both eyes daily.    Yes Historical Provider, MD  Calcium Carb-Cholecalciferol (OYSTER SHELL CALCIUM + D) 500-400 MG-UNIT TABS Take 1 tablet by mouth daily.   Yes Historical Provider, MD  capsicum oleoresin (TRIXAICIN) 0.025 % cream Apply 1 application topically 2 (two) times daily as needed (for pain).    Yes Historical Provider, MD  cloNIDine (CATAPRES) 0.1 MG tablet Take 0.1 mg by mouth 2 (two) times daily.   Yes Historical Provider,  MD  clopidogrel (PLAVIX) 75 MG tablet Take 1 tablet (75 mg total) by mouth daily with breakfast. 05/14/13  Yes Rhetta Mura, MD  docusate sodium (COLACE) 100 MG capsule Take 200 mg by mouth at bedtime.    Yes Historical Provider, MD  guaiFENesin (MUCINEX) 600 MG 12 hr tablet Take 600 mg by mouth 2 (two) times daily.   Yes Historical Provider, MD  ibandronate (BONIVA) 150 MG tablet Take 150 mg by mouth every 30 (thirty) days. Take in the morning with a full glass of water, on an empty stomach, and do not take anything else by mouth or lie down for the next 30 min.   Yes Historical Provider, MD  isosorbide mononitrate (IMDUR) 30 MG 24 hr tablet Take 1 tablet (30 mg total) by mouth daily. 05/11/13  Yes Zannie Cove, MD  lisinopril (PRINIVIL,ZESTRIL) 5 MG tablet Take 5 mg by mouth daily.   Yes Historical Provider, MD  metFORMIN (GLUCOPHAGE) 1000 MG tablet Take 1,000 mg by mouth daily with breakfast.   Yes Historical Provider, MD  Multiple Vitamins-Minerals (I-VITE PO) Take 1 tablet by mouth daily.   Yes Historical Provider, MD  nebivolol (BYSTOLIC) 5 MG tablet Take 5 mg by mouth daily.   Yes Historical Provider, MD  Polyethyl Glycol-Propyl Glycol (SYSTANE ULTRA OP) Place 2 drops into both eyes at bedtime.   Yes Historical Provider, MD  polyethylene glycol (MIRALAX / GLYCOLAX) packet Take 17 g by mouth daily as needed for mild constipation.   Yes Historical Provider, MD  pregabalin (LYRICA) 150 MG capsule Take 150 mg by mouth 2 (two) times daily.    Yes Historical Provider, MD  psyllium (REGULOID) 0.52 G capsule Take 0.52 g by mouth daily.   Yes Historical Provider, MD  simvastatin (ZOCOR) 10 MG tablet Take 15 mg by mouth at bedtime.    Yes Historical Provider, MD  Zinc Oxide (TRIPLE PASTE) 12.8 % ointment Apply 1 application topically as needed for irritation. 11/12/14  Yes Marily Memos, MD  calcium-vitamin D (OSCAL WITH D) 500-200 MG-UNIT per tablet Take 1 tablet by mouth daily.    Historical  Provider, MD  polyvinyl alcohol (LIQUIFILM TEARS) 1.4 % ophthalmic solution Place 1 drop into both eyes 6 (six) times daily.    Historical Provider, MD   BP 189/62 mmHg  Pulse 62  Temp(Src) 97.2 F (36.2 C) (Oral)  Resp 16  SpO2 96% Physical Exam  Constitutional: She is oriented to person, place, and time. She appears well-developed and well-nourished. No distress.  HENT:  Nose: Nose normal.  Mouth/Throat: Oropharynx is clear and moist. No oropharyngeal exudate.  4 cm circumferential soft  tissue hematoma to the right side of the head. It is tender to palpation. I cannot feel any bony deformities.  Eyes: Conjunctivae and EOM are normal. Pupils are equal, round, and reactive to light. No scleral icterus.  Neck: Normal range of motion. Neck supple. No JVD present. No tracheal deviation present. No thyromegaly present.  Cardiovascular: Normal rate, regular rhythm and normal heart sounds.  Exam reveals no gallop and no friction rub.   No murmur heard. Pulmonary/Chest: Effort normal and breath sounds normal. No respiratory distress. She has no wheezes. She exhibits no tenderness.  Abdominal: Soft. Bowel sounds are normal. She exhibits no distension and no mass. There is no tenderness. There is no rebound and no guarding.  Musculoskeletal: Normal range of motion. She exhibits no edema or tenderness.  Lymphadenopathy:    She has no cervical adenopathy.  Neurological: She is alert and oriented to person, place, and time. No cranial nerve deficit. She exhibits normal muscle tone.  Normal strength and sensation in 4 extremities.  Skin: Skin is warm and dry. No rash noted. She is not diaphoretic. No erythema. No pallor.  Nursing note and vitals reviewed.   ED Course  Procedures (including critical care time) Labs Review Labs Reviewed  CBC WITH DIFFERENTIAL/PLATELET - Abnormal; Notable for the following:    Neutrophils Relative % 40 (*)    Eosinophils Relative 6 (*)    Basophils Relative 2  (*)    All other components within normal limits  I-STAT CHEM 8, ED - Abnormal; Notable for the following:    BUN 24 (*)    Glucose, Bld 125 (*)    All other components within normal limits  CBG MONITORING, ED - Abnormal; Notable for the following:    Glucose-Capillary 105 (*)    All other components within normal limits  PROTIME-INR  URINALYSIS, ROUTINE W REFLEX MICROSCOPIC  ETHANOL  URINE RAPID DRUG SCREEN (HOSP PERFORMED)  I-STAT CG4 LACTIC ACID, ED  Rosezena SensorI-STAT TROPOININ, ED    Imaging Review Dg Chest 2 View  02/21/2015   CLINICAL DATA:  Dizziness, fall last night.  EXAM: CHEST  2 VIEW  COMPARISON:  05/12/2013  FINDINGS: The heart size is normal, mild tortuosity of the thoracic aorta. Pulmonary vasculature is normal. No consolidation, pleural effusion, or pneumothorax. No acute osseous abnormalities are seen.  IMPRESSION: No acute pulmonary process.   Electronically Signed   By: Rubye OaksMelanie  Ehinger M.D.   On: 02/21/2015 06:58   Ct Head Wo Contrast  02/21/2015   CLINICAL DATA:  Trip and fall going to the bathroom this morning. Struck right side of head on wall. No loss of consciousness.  EXAM: CT HEAD WITHOUT CONTRAST  CT CERVICAL SPINE WITHOUT CONTRAST  TECHNIQUE: Multidetector CT imaging of the head and cervical spine was performed following the standard protocol without intravenous contrast. Multiplanar CT image reconstructions of the cervical spine were also generated.  COMPARISON:  Head and cervical spine CT 07/18/2014  FINDINGS: CT HEAD FINDINGS  No intracranial hemorrhage, mass effect, or midline shift. No hydrocephalus. The basilar cisterns are patent. No evidence of territorial infarct. No intracranial fluid collection. Chronic small vessel ischemic change, atrophy, and remote lacunar infarcts in the right basal ganglia, unchanged. Calvarium is intact. Included paranasal sinuses and mastoid air cells are well aerated.  CT CERVICAL SPINE FINDINGS  Vertebral body heights are preserved. There is  no fracture. The dens is intact. There are no jumped or perched facets. There is diffuse degenerative disc disease with disc space  narrowing and endplate spurring throughout cervical spine. Minimal anterolisthesis of C3 on C4 is unchanged and likely degenerative. There is multilevel facet arthropathy. No prevertebral soft tissue edema.  IMPRESSION: 1. No acute intracranial abnormality. Stable atrophy and chronic small vessel ischemic change. 2. No fracture or subluxation of the cervical spine. Stable advanced degenerative change.   Electronically Signed   By: Rubye Oaks M.D.   On: 02/21/2015 06:47   Ct Cervical Spine Wo Contrast  02/21/2015   CLINICAL DATA:  Trip and fall going to the bathroom this morning. Struck right side of head on wall. No loss of consciousness.  EXAM: CT HEAD WITHOUT CONTRAST  CT CERVICAL SPINE WITHOUT CONTRAST  TECHNIQUE: Multidetector CT imaging of the head and cervical spine was performed following the standard protocol without intravenous contrast. Multiplanar CT image reconstructions of the cervical spine were also generated.  COMPARISON:  Head and cervical spine CT 07/18/2014  FINDINGS: CT HEAD FINDINGS  No intracranial hemorrhage, mass effect, or midline shift. No hydrocephalus. The basilar cisterns are patent. No evidence of territorial infarct. No intracranial fluid collection. Chronic small vessel ischemic change, atrophy, and remote lacunar infarcts in the right basal ganglia, unchanged. Calvarium is intact. Included paranasal sinuses and mastoid air cells are well aerated.  CT CERVICAL SPINE FINDINGS  Vertebral body heights are preserved. There is no fracture. The dens is intact. There are no jumped or perched facets. There is diffuse degenerative disc disease with disc space narrowing and endplate spurring throughout cervical spine. Minimal anterolisthesis of C3 on C4 is unchanged and likely degenerative. There is multilevel facet arthropathy. No prevertebral soft tissue  edema.  IMPRESSION: 1. No acute intracranial abnormality. Stable atrophy and chronic small vessel ischemic change. 2. No fracture or subluxation of the cervical spine. Stable advanced degenerative change.   Electronically Signed   By: Rubye Oaks M.D.   On: 02/21/2015 06:47   Dg Hip Unilat With Pelvis 2-3 Views Right  02/21/2015   CLINICAL DATA:  Fall last night, now with right hip pain.  EXAM: RIGHT HIP (WITH PELVIS) 2-3 VIEWS  COMPARISON:  None.  FINDINGS: The cortical margins of the bony pelvis are intact. No fracture. Pubic symphysis and sacroiliac joints are congruent. Both femoral heads are well-seated in the respective acetabula. Mild osteoarthritis of both hips on the pubic symphysis.  IMPRESSION: No fracture of the pelvis or right hip.  Mild osteoarthritis.   Electronically Signed   By: Rubye Oaks M.D.   On: 02/21/2015 07:00     EKG Interpretation   Date/Time:  Tuesday February 21 2015 04:11:45 EST Ventricular Rate:  72 PR Interval:  62 QRS Duration: 95 QT Interval:  401 QTC Calculation: 439 R Axis:   52 Text Interpretation:  Sinus rhythm Baseline wander in lead(s) II III aVF  No significant change since last tracing Confirmed by Erroll Luna [phone]) on 02/21/2015 4:24:05 AM      MDM   Final diagnoses:  Fall, initial encounter  Secondary hypertension, unspecified    Patient presents emergency room after a fall. Will obtain CT scan to assess for injury, perform an infectious workup. EKG does not reveal a cause for syncope.  Infectious workup was negative as well. Upon repeat evaluation of the patient, her pain has improved with Tylenol. Patient's blood pressure continues to remain high, upon chart review it appears that she lives in the 180s systolic as high as the 200s systolic. Blood pressure is 181/61 Dennie Fetters the room. Patient has a  list of 8 AM high blood pressure medications to take. Will defer this to nursing home to ensure she does not get 2 doses. Patient was  educated on her high blood pressure and need to follow-up with her primary care physician for closer management. Here she is asymptomatic from this standpoint. Her vital signs remain within her normal limits and she is safe for discharge.    Tomasita Crumble, MD 02/21/15 1434

## 2015-02-21 NOTE — Discharge Instructions (Signed)
Fall Prevention and Home Safety [name], your CT scans and x-rays did not show any significant injury. Follow-up with your primary care physician within 3 days for fall prevention and safety as well as blood pressure control. If any symptoms worsen come back to the emergency department immediately. Thank you. Falls cause injuries and can affect all age groups. It is possible to prevent falls.  HOW TO PREVENT FALLS  Wear shoes with rubber soles that do not have an opening for your toes.  Keep the inside and outside of your house well lit.  Use night lights throughout your home.  Remove clutter from floors.  Clean up floor spills.  Remove throw rugs or fasten them to the floor with carpet tape.  Do not place electrical cords across pathways.  Put grab bars by your tub, shower, and toilet. Do not use towel bars as grab bars.  Put handrails on both sides of the stairway. Fix loose handrails.  Do not climb on stools or stepladders, if possible.  Do not wax your floors.  Repair uneven or unsafe sidewalks, walkways, or stairs.  Keep items you use a lot within reach.  Be aware of pets.  Keep emergency numbers next to the telephone.  Put smoke detectors in your home and near bedrooms. Ask your doctor what other things you can do to prevent falls. Document Released: 09/28/2009 Document Revised: 06/02/2012 Document Reviewed: 03/03/2012 [facility] Patient Information 2015 [place], [place]. This information is not intended to replace advice given to you by your health care provider. Make sure you discuss any questions you have with your health care provider. Hypertension Hypertension is another name for high blood pressure. High blood pressure forces your heart to work harder to pump blood. A blood pressure reading has two numbers, which includes a higher number over a lower number (example: 110/72). HOME CARE   Have your blood pressure rechecked by your doctor.  Only take medicine  as told by your doctor. Follow the directions carefully. The medicine does not work as well if you skip doses. Skipping doses also puts you at risk for problems.  Do not smoke.  Monitor your blood pressure at home as told by your doctor. GET HELP IF:  You think you are having a reaction to the medicine you are taking.  You have repeat headaches or feel dizzy.  You have puffiness (swelling) in your ankles.  You have trouble with your vision. GET HELP RIGHT AWAY IF:   You get a very bad headache and are confused.  You feel weak, numb, or faint.  You get chest or belly (abdominal) pain.  You throw up (vomit).  You cannot breathe very well. MAKE SURE YOU:   Understand these instructions.  Will watch your condition.  Will get help right away if you are not doing well or get worse. Document Released: 05/20/2008 Document Revised: 12/07/2013 Document Reviewed: 09/24/2013 [facility] Patient Information 2015 [place], [place]. This information is not intended to replace advice given to you by your health care provider. Make sure you discuss any questions you have with your health care provider.

## 2015-02-21 NOTE — ED Notes (Signed)
Patient was ambulating with walker to the bathroom [facility] and experienced an unwitnessed fall. Hematoma to the right side of head with swelling and bruising. Patient is A&Ox4 with inappropriate speech patterns. Shirt was put on backwards by the resident and the nursing staff at the facility said this was not her normal. She required assistance x2 with getting off the toilet, per EMS, when picked up. This is also not the baseline for patient. She told EMS when they were departing that she was unsure how to sign the documentation. With instructions she said she does not know how to sign again.

## 2015-02-21 NOTE — ED Notes (Signed)
MD Hyacinth MeekerMiller made aware of patients BP, no new orders.

## 2015-02-21 NOTE — ED Notes (Signed)
[facility] [phone].  No RN's available until 9am, only med tech available.  Per [place], can't give report to med tech.

## 2015-03-14 ENCOUNTER — Ambulatory Visit (INDEPENDENT_AMBULATORY_CARE_PROVIDER_SITE_OTHER): Payer: Medicare PPO | Admitting: Podiatry

## 2015-03-14 ENCOUNTER — Ambulatory Visit: Payer: Self-pay

## 2015-03-14 DIAGNOSIS — B351 Tinea unguium: Secondary | ICD-10-CM

## 2015-03-14 DIAGNOSIS — M79673 Pain in unspecified foot: Secondary | ICD-10-CM | POA: Diagnosis not present

## 2015-03-14 DIAGNOSIS — S99922A Unspecified injury of left foot, initial encounter: Secondary | ICD-10-CM

## 2015-03-14 DIAGNOSIS — R609 Edema, unspecified: Secondary | ICD-10-CM | POA: Diagnosis not present

## 2015-03-15 NOTE — Progress Notes (Signed)
Subjective:     Patient ID: Judy Perez, female   DOB: Mar 31, 1930, 81 y.o.   MRN: [id number]  HPI patient states I have a lot of nail disease 1-5 of both feet and a lot of swelling of my left ankle as it was run into by a wheelchair and I'm worried I may have broken something   Review of Systems     Objective:   Physical Exam Neurovascular status found to be within normal limits with edema in the left ankle lateral side with moderate pain when pressed and mild reduction of motion ankle joint subtalar joint secondary to splinting from pain. Nails are thickened and yellow and brittle 1-5 of both feet    Assessment:     Edema of the left ankle secondary to injury with possibility for bone injury and also nail disease with pain 1-5 both feet    Plan:     X-ray an H&P reviewed and today I applied Unna boot Ace wrap surgical shoe to reduce the swelling. Gave instructions to take it off in 3-4 days or if she should develop any discoloration in toes or increased pain. Debrided nailbeds 1-5 both feet with no iatrogenic bleeding noted

## 2015-05-18 ENCOUNTER — Encounter: Payer: Self-pay | Admitting: Podiatry

## 2015-05-18 ENCOUNTER — Ambulatory Visit (INDEPENDENT_AMBULATORY_CARE_PROVIDER_SITE_OTHER): Payer: Medicare PPO | Admitting: Podiatry

## 2015-05-18 VITALS — BP 134/64 | HR 63 | Resp 12

## 2015-05-18 DIAGNOSIS — M79606 Pain in leg, unspecified: Secondary | ICD-10-CM

## 2015-05-18 DIAGNOSIS — B351 Tinea unguium: Secondary | ICD-10-CM

## 2015-05-19 NOTE — Progress Notes (Signed)
Subjective:     Patient ID: Judy Perez, female   DOB: 08-05-1930, 81 y.o.   MRN: [id number]  HPI patient states that she still getting some pain in her left ankle but not as intense and that her nails are thick and she cannot take care of them herself   Review of Systems     Objective:   Physical Exam Neurovascular status intact with no other health history changes and pain of a mild nature in the left ankle lateral side and thick yellow brittle nailbeds 1-5 both feet    Assessment:     Mycotic nail infection with pain 1-5 both feet and mild ankle pain left    Plan:     Continue stretching exercises for the ankle and debrided nailbeds 1-5 both feet with no iatrogenic bleeding noted

## 2015-06-27 ENCOUNTER — Emergency Department (HOSPITAL_COMMUNITY)
Admission: EM | Admit: 2015-06-27 | Discharge: 2015-06-28 | Disposition: A | Discharge status: Home / Self Care | Payer: Medicare PPO | Attending: Emergency Medicine | Admitting: Emergency Medicine

## 2015-06-27 ENCOUNTER — Emergency Department (HOSPITAL_COMMUNITY): Payer: Medicare PPO

## 2015-06-27 ENCOUNTER — Encounter (HOSPITAL_COMMUNITY): Payer: Self-pay | Admitting: Emergency Medicine

## 2015-06-27 DIAGNOSIS — Z7982 Long term (current) use of aspirin: Secondary | ICD-10-CM | POA: Insufficient documentation

## 2015-06-27 DIAGNOSIS — M1990 Unspecified osteoarthritis, unspecified site: Secondary | ICD-10-CM | POA: Diagnosis not present

## 2015-06-27 DIAGNOSIS — Z7902 Long term (current) use of antithrombotics/antiplatelets: Secondary | ICD-10-CM | POA: Insufficient documentation

## 2015-06-27 DIAGNOSIS — E785 Hyperlipidemia, unspecified: Secondary | ICD-10-CM | POA: Diagnosis not present

## 2015-06-27 DIAGNOSIS — W19XXXA Unspecified fall, initial encounter: Secondary | ICD-10-CM

## 2015-06-27 DIAGNOSIS — I2510 Atherosclerotic heart disease of native coronary artery without angina pectoris: Secondary | ICD-10-CM | POA: Diagnosis not present

## 2015-06-27 DIAGNOSIS — I10 Essential (primary) hypertension: Secondary | ICD-10-CM | POA: Diagnosis not present

## 2015-06-27 DIAGNOSIS — Y998 Other external cause status: Secondary | ICD-10-CM | POA: Insufficient documentation

## 2015-06-27 DIAGNOSIS — Z8673 Personal history of transient ischemic attack (TIA), and cerebral infarction without residual deficits: Secondary | ICD-10-CM | POA: Diagnosis not present

## 2015-06-27 DIAGNOSIS — Y9389 Activity, other specified: Secondary | ICD-10-CM | POA: Insufficient documentation

## 2015-06-27 DIAGNOSIS — Z79899 Other long term (current) drug therapy: Secondary | ICD-10-CM | POA: Diagnosis not present

## 2015-06-27 DIAGNOSIS — E119 Type 2 diabetes mellitus without complications: Secondary | ICD-10-CM | POA: Insufficient documentation

## 2015-06-27 DIAGNOSIS — W1812XA Fall from or off toilet with subsequent striking against object, initial encounter: Secondary | ICD-10-CM | POA: Insufficient documentation

## 2015-06-27 DIAGNOSIS — Y92031 Bathroom in apartment as the place of occurrence of the external cause: Secondary | ICD-10-CM | POA: Diagnosis not present

## 2015-06-27 DIAGNOSIS — Z87891 Personal history of nicotine dependence: Secondary | ICD-10-CM | POA: Diagnosis not present

## 2015-06-27 DIAGNOSIS — S0990XA Unspecified injury of head, initial encounter: Secondary | ICD-10-CM | POA: Insufficient documentation

## 2015-06-27 NOTE — ED Provider Notes (Signed)
CSN: [id number]     Arrival date & time 06/27/15  1929 History   First MD Initiated Contact with Patient 06/27/15 1930     Chief Complaint  Patient presents with  . Fall  . Headache   HPI   81 year old female presents via EMS for a fall. EMS reports that this is the second fall today, history of falls. Patient states that she was sitting on the toilet herd the phone ringing and attempted to get up slipping off the toilet striking the posterior aspect of her head on the wall. Patient denies loss of consciousness, reports minimal pain to the head. Patient reports baseline diffuse body pain, no additional pain from the fall. Patient takes Plavix, aspirin. Per nursing facility patient has been acting appropriately and is at her baseline.  Past Medical History  Diagnosis Date  . CAD (coronary artery disease)     a. 2004 Cath/PCI with stenting x 1 in [place].;  Lexiscan Myoview 6/14:normal study, no ischemia, EF 79%  . Hypertension   . Hyperlipemia   . History of tobacco abuse     a. 50 pack yr hx, quit 2004  . History of shingles     a. left leg with residual pain.  . Peripheral neuropathy   . Hard of hearing   . Fibrous breast lumps     "both sides; I did not have cancer" (05/13/2013)  . Family history of anesthesia complication     "took a long time to wake my mother up after one of her surgeries" (05/13/2013)  . Macular degeneration, wet     "both eyes" (05/13/2013)  . Type II diabetes mellitus   . Arthritis     "hands; maybe feet" (05/13/2013)  . TIA (transient ischemic attack)   . Peripheral neuropathy 05/23/2014   Past Surgical History  Procedure Laterality Date  . Tonsillectomy    . Appendectomy    . Carpal tunnel release Bilateral   . Mastectomy Bilateral 1980's    "multiple fibrous cysts; never had cancer" (05/13/2013)20  . Cesarean section  1960; 1962  . Inner ear surgery Left 1962    "put a piece of steel and a tube & a vein from my ?left hand in" (05/13/2013)  . Cataract  extraction w/ intraocular lens  implant, bilateral Bilateral 2000  . Breast biopsy    . Tubal ligation  1963  . Coronary angioplasty with stent placement  2004    "1" (05/13/2013)   Family History  Problem Relation Age of Onset  . Stroke Father     deceased  . Other Mother     deceased - possibly renal failure   History  Substance Use Topics  . Smoking status: Former Smoker -- 1.00 packs/day for 50 years    Types: Cigarettes    Quit date: 05/12/2003  . Smokeless tobacco: Never Used  . Alcohol Use: Yes     Comment: 05/13/2013 "socially; very seldom anymore"   OB History    No data available     Review of Systems  All other systems reviewed and are negative.   Allergies  Review of patient's allergies indicates no known allergies.  Home Medications   Prior to Admission medications   Medication Sig Start Date End Date Taking? Authorizing Provider  clopidogrel (PLAVIX) 75 MG tablet Take 1 tablet (75 mg total) by mouth daily with breakfast. 05/14/13  Yes Rhetta Mura, MD  ibandronate (BONIVA) 150 MG tablet Take 150 mg by mouth every 30 (thirty) days.  Take in the morning with a full glass of water, on an empty stomach, and do not take anything else by mouth or lie down for the next 30 min.   Yes Historical Provider, MD  nebivolol (BYSTOLIC) 5 MG tablet Take 5 mg by mouth daily.   Yes Historical Provider, MD  acetaminophen (TYLENOL) 500 MG tablet Take 500 mg by mouth 3 (three) times daily.     Historical Provider, MD  acetaminophen (TYLENOL) 500 MG tablet Take 500 mg by mouth every 8 (eight) hours as needed for mild pain.    Historical Provider, MD  aspirin 325 MG buffered tablet Take 325 mg by mouth daily.    Historical Provider, MD  bimatoprost (LUMIGAN) 0.01 % SOLN Place 2 drops into both eyes daily.     Historical Provider, MD  Calcium Carb-Cholecalciferol (OYSTER SHELL CALCIUM + D) 500-400 MG-UNIT TABS Take 1 tablet by mouth daily.    Historical Provider, MD   calcium-vitamin D (OSCAL WITH D) 500-200 MG-UNIT per tablet Take 1 tablet by mouth daily.    Historical Provider, MD  capsicum oleoresin (TRIXAICIN) 0.025 % cream Apply 1 application topically 2 (two) times daily as needed (for pain).     Historical Provider, MD  cloNIDine (CATAPRES) 0.1 MG tablet Take 0.1 mg by mouth 2 (two) times daily.    Historical Provider, MD  docusate sodium (COLACE) 100 MG capsule Take 200 mg by mouth at bedtime.     Historical Provider, MD  guaiFENesin (MUCINEX) 600 MG 12 hr tablet Take 600 mg by mouth 2 (two) times daily.    Historical Provider, MD  isosorbide mononitrate (IMDUR) 30 MG 24 hr tablet Take 1 tablet (30 mg total) by mouth daily. 05/11/13   Zannie Cove, MD  lisinopril (PRINIVIL,ZESTRIL) 5 MG tablet Take 5 mg by mouth daily.    Historical Provider, MD  metFORMIN (GLUCOPHAGE) 1000 MG tablet Take 1,000 mg by mouth daily with breakfast.    Historical Provider, MD  Multiple Vitamins-Minerals (I-VITE PO) Take 1 tablet by mouth daily.    Historical Provider, MD  Polyethyl Glycol-Propyl Glycol (SYSTANE ULTRA OP) Place 2 drops into both eyes at bedtime.    Historical Provider, MD  polyethylene glycol (MIRALAX / GLYCOLAX) packet Take 17 g by mouth daily as needed for mild constipation.    Historical Provider, MD  polyvinyl alcohol (LIQUIFILM TEARS) 1.4 % ophthalmic solution Place 1 drop into both eyes 6 (six) times daily.    Historical Provider, MD  pregabalin (LYRICA) 150 MG capsule Take 150 mg by mouth 2 (two) times daily.     Historical Provider, MD  psyllium (REGULOID) 0.52 G capsule Take 0.52 g by mouth daily.    Historical Provider, MD  simvastatin (ZOCOR) 10 MG tablet Take 15 mg by mouth at bedtime.     Historical Provider, MD  Zinc Oxide (TRIPLE PASTE) 12.8 % ointment Apply 1 application topically as needed for irritation. 11/12/14   Barbara Cower Mesner, MD   BP 145/65 mmHg  Pulse 65  Temp(Src) 98.1 F (36.7 C) (Oral)  Resp 20  SpO2 93%   Physical Exam   Constitutional: She is oriented to person, place, and time. She appears well-developed and well-nourished.  HENT:  Head: Normocephalic and atraumatic.  Eyes: Conjunctivae are normal. Pupils are equal, round, and reactive to light. Right eye exhibits no discharge. Left eye exhibits no discharge. No scleral icterus.  Neck: Normal range of motion. No JVD present. No tracheal deviation present.  Pulmonary/Chest: Effort normal. No  stridor.  Musculoskeletal:  Tenderness to CPAP spine, no T or L-spine tenderness, full active range of motion of all extremities 5 out of 5 strength. Grip strength 5 out of 5  Neurological: She is alert and oriented to person, place, and time. She has normal strength. No cranial nerve deficit or sensory deficit. Coordination normal. GCS eye subscore is 4. GCS verbal subscore is 5. GCS motor subscore is 6.  Sensation grossly intact  Psychiatric: She has a normal mood and affect. Her behavior is normal. Judgment and thought content normal.  Nursing note and vitals reviewed.   ED Course  Procedures (including critical care time) Labs Review Labs Reviewed - No data to display  Imaging Review Dg Wrist Complete Right  06/27/2015   CLINICAL DATA:  Fall with pain in the right wrist at the first digit and radial aspect.  EXAM: RIGHT WRIST - COMPLETE 3+ VIEW  COMPARISON:  None.  FINDINGS: Degenerative changes in the carpal bones involving radiocarpal, STT, first carpometacarpal, and intercarpal joints. Calcification in the region of the triangular fibrocartilage. Small cystic changes in the carpal bones likely representing degenerative cysts. Tiny osseous fragment demonstrated adjacent to the radial styloid process with mild overlying soft tissue swelling. Given that the patient's symptoms are in this area, this could indicate a small avulsion fracture. However, this could indicate a chronic ligamentous calcification as seen elsewhere in the joint. No acute displaced fractures are  demonstrated.  IMPRESSION: Prominent degenerative changes throughout the right wrist. Tiny osseous fragment adjacent to the radial styloid process is indeterminate and could represent a small avulsion fragment or ligamentous calcification.   Electronically Signed   By: Burman Nieves M.D.   On: 06/27/2015 21:42   Ct Head Wo Contrast  06/27/2015   CLINICAL DATA:  81 year old female status post fall  EXAM: CT HEAD WITHOUT CONTRAST  CT CERVICAL SPINE WITHOUT CONTRAST  TECHNIQUE: Multidetector CT imaging of the head and cervical spine was performed following the standard protocol without intravenous contrast. Multiplanar CT image reconstructions of the cervical spine were also generated.  COMPARISON:  CT dated 02/21/2015  FINDINGS: CT HEAD FINDINGS  The ventricles are dilated and the sulci are prominent compatible with age-related atrophy. Periventricular and deep white matter hypodensities represent chronic microvascular ischemic changes. Stable old right basal ganglia lacunar infarct. There is no intracranial hemorrhage. No mass effect or midline shift identified.  The visualized paranasal sinuses and mastoid air cells are well aerated. The calvarium is intact.  CT CERVICAL SPINE FINDINGS  There is no acute fracture or subluxation of the cervical spine.There is extensive multilevel degenerative changes with loss of disc space height most prominent at C4-C6.The odontoid and spinous processes are intact.There is normal anatomic alignment of the C1-C2 lateral masses. The visualized soft tissues appear unremarkable.  IMPRESSION: No acute intracranial pathology.  Age-related atrophy and chronic microvascular ischemic disease.  No acute cervical spine fracture.   Electronically Signed   By: Elgie Collard M.D.   On: 06/27/2015 20:43   Ct Cervical Spine Wo Contrast  06/27/2015   CLINICAL DATA:  81 year old female status post fall  EXAM: CT HEAD WITHOUT CONTRAST  CT CERVICAL SPINE WITHOUT CONTRAST  TECHNIQUE:  Multidetector CT imaging of the head and cervical spine was performed following the standard protocol without intravenous contrast. Multiplanar CT image reconstructions of the cervical spine were also generated.  COMPARISON:  CT dated 02/21/2015  FINDINGS: CT HEAD FINDINGS  The ventricles are dilated and the sulci are prominent compatible with  age-related atrophy. Periventricular and deep white matter hypodensities represent chronic microvascular ischemic changes. Stable old right basal ganglia lacunar infarct. There is no intracranial hemorrhage. No mass effect or midline shift identified.  The visualized paranasal sinuses and mastoid air cells are well aerated. The calvarium is intact.  CT CERVICAL SPINE FINDINGS  There is no acute fracture or subluxation of the cervical spine.There is extensive multilevel degenerative changes with loss of disc space height most prominent at C4-C6.The odontoid and spinous processes are intact.There is normal anatomic alignment of the C1-C2 lateral masses. The visualized soft tissues appear unremarkable.  IMPRESSION: No acute intracranial pathology.  Age-related atrophy and chronic microvascular ischemic disease.  No acute cervical spine fracture.   Electronically Signed   By: Elgie CollardArash  Radparvar M.D.   On: 06/27/2015 20:43     EKG Interpretation None      MDM   Final diagnoses:  Fall    Labs:  Imaging: CT head, CT cervical  Consults:  Therapeutics:  Assessment:  Plan: Patient presents with a fall today. This was from sitting, very minimal mechanism, patient has minimal complaints. No signs of trauma on exam, CT head and neck normal. No loss of consciousness no altered mental status, this was a mechanical fall. Patient will be transferred back to nursing facility.       Eyvonne MechanicJeffrey Alanis Clift, PA-C 06/29/15 0146  Gerhard Munchobert Lockwood, MD 06/29/15 [phone]

## 2015-06-27 NOTE — ED Notes (Signed)
Family at bedside. 

## 2015-06-27 NOTE — ED Notes (Signed)
MD at bedside. 

## 2015-06-27 NOTE — ED Notes (Signed)
Pt arrives via [facility] [address]. Pt was sitting on toilet when she heard the phone ring and tried to get up, states that she fell off the toilet onto the bathroom floor and hit her head. According to EMS this was patient's second fall of the day per the facilities report. Pt does take blood thinners, alert and oriented.

## 2015-06-27 NOTE — Discharge Instructions (Signed)

## 2015-06-27 NOTE — ED Notes (Signed)
Patient transported to X-ray 

## 2015-06-27 NOTE — ED Notes (Signed)
PA Hedges at bedside  

## 2015-06-27 NOTE — ED Notes (Signed)
Patient transported to CT 

## 2015-08-31 ENCOUNTER — Ambulatory Visit: Payer: Medicare PPO | Admitting: Podiatry

## 2015-09-06 ENCOUNTER — Encounter (HOSPITAL_COMMUNITY): Payer: Self-pay

## 2015-09-06 ENCOUNTER — Emergency Department (HOSPITAL_COMMUNITY)
Admission: EM | Admit: 2015-09-06 | Discharge: 2015-09-07 | Disposition: A | Discharge status: Home / Self Care | Payer: Medicare PPO | Attending: Emergency Medicine | Admitting: Emergency Medicine

## 2015-09-06 ENCOUNTER — Emergency Department (HOSPITAL_COMMUNITY): Payer: Medicare PPO

## 2015-09-06 DIAGNOSIS — G629 Polyneuropathy, unspecified: Secondary | ICD-10-CM | POA: Insufficient documentation

## 2015-09-06 DIAGNOSIS — Z9861 Coronary angioplasty status: Secondary | ICD-10-CM | POA: Insufficient documentation

## 2015-09-06 DIAGNOSIS — Z87891 Personal history of nicotine dependence: Secondary | ICD-10-CM | POA: Diagnosis not present

## 2015-09-06 DIAGNOSIS — Y9389 Activity, other specified: Secondary | ICD-10-CM | POA: Insufficient documentation

## 2015-09-06 DIAGNOSIS — Y998 Other external cause status: Secondary | ICD-10-CM | POA: Diagnosis not present

## 2015-09-06 DIAGNOSIS — E785 Hyperlipidemia, unspecified: Secondary | ICD-10-CM | POA: Insufficient documentation

## 2015-09-06 DIAGNOSIS — Z79899 Other long term (current) drug therapy: Secondary | ICD-10-CM | POA: Diagnosis not present

## 2015-09-06 DIAGNOSIS — M1990 Unspecified osteoarthritis, unspecified site: Secondary | ICD-10-CM | POA: Diagnosis not present

## 2015-09-06 DIAGNOSIS — Z7982 Long term (current) use of aspirin: Secondary | ICD-10-CM | POA: Diagnosis not present

## 2015-09-06 DIAGNOSIS — Z8673 Personal history of transient ischemic attack (TIA), and cerebral infarction without residual deficits: Secondary | ICD-10-CM | POA: Insufficient documentation

## 2015-09-06 DIAGNOSIS — Y92129 Unspecified place in nursing home as the place of occurrence of the external cause: Secondary | ICD-10-CM | POA: Diagnosis not present

## 2015-09-06 DIAGNOSIS — W01198A Fall on same level from slipping, tripping and stumbling with subsequent striking against other object, initial encounter: Secondary | ICD-10-CM | POA: Diagnosis not present

## 2015-09-06 DIAGNOSIS — S0093XA Contusion of unspecified part of head, initial encounter: Secondary | ICD-10-CM

## 2015-09-06 DIAGNOSIS — Z8742 Personal history of other diseases of the female genital tract: Secondary | ICD-10-CM | POA: Insufficient documentation

## 2015-09-06 DIAGNOSIS — S0990XA Unspecified injury of head, initial encounter: Secondary | ICD-10-CM | POA: Diagnosis present

## 2015-09-06 DIAGNOSIS — H9190 Unspecified hearing loss, unspecified ear: Secondary | ICD-10-CM | POA: Diagnosis not present

## 2015-09-06 DIAGNOSIS — Z7902 Long term (current) use of antithrombotics/antiplatelets: Secondary | ICD-10-CM | POA: Insufficient documentation

## 2015-09-06 DIAGNOSIS — E119 Type 2 diabetes mellitus without complications: Secondary | ICD-10-CM | POA: Diagnosis not present

## 2015-09-06 DIAGNOSIS — I2510 Atherosclerotic heart disease of native coronary artery without angina pectoris: Secondary | ICD-10-CM | POA: Diagnosis not present

## 2015-09-06 DIAGNOSIS — I10 Essential (primary) hypertension: Secondary | ICD-10-CM | POA: Insufficient documentation

## 2015-09-06 DIAGNOSIS — S0083XA Contusion of other part of head, initial encounter: Secondary | ICD-10-CM | POA: Diagnosis not present

## 2015-09-06 LAB — I-STAT CHEM 8, ED
BUN: 24 mg/dL — AB (ref 6–20)
Calcium, Ion: 1.22 mmol/L (ref 1.13–1.30)
Chloride: 103 mmol/L (ref 101–111)
Creatinine, Ser: 1 mg/dL (ref 0.44–1.00)
GLUCOSE: 137 mg/dL — AB (ref 65–99)
HCT: 38 % (ref 36.0–46.0)
HEMOGLOBIN: 12.9 g/dL (ref 12.0–15.0)
POTASSIUM: 4.8 mmol/L (ref 3.5–5.1)
Sodium: 141 mmol/L (ref 135–145)
TCO2: 26 mmol/L (ref 0–100)

## 2015-09-06 NOTE — ED Notes (Signed)
Bed: OZ30 Expected date:  Expected time:  Means of arrival:  Comments: 84 fall, hematoma

## 2015-09-06 NOTE — ED Notes (Signed)
EMS reported that pt was NOT on blood thinners however pharmacy states that pt IS on PLAVIX

## 2015-09-06 NOTE — Discharge Instructions (Signed)
Tylenol for pain.  Follow up with your md next week.   

## 2015-09-06 NOTE — ED Notes (Signed)
Pt fell in the bathroom, states she became dizzy and fell hitting her head, she pulled the help me line and was on the floor about 5 minutes, no LOC, no blood thinners, fall was unwitnessed

## 2015-09-07 NOTE — ED Notes (Signed)
[facility] called for transportation back to [place], report called

## 2015-09-07 NOTE — ED Notes (Signed)
Pt transported back to [place] via [facility].

## 2015-09-08 NOTE — ED Provider Notes (Signed)
CSN: [id number]     Arrival date & time 09/06/15  2058 History   First MD Initiated Contact with Patient 09/06/15 2101     Chief Complaint  Patient presents with  . Fall     (Consider location/radiation/quality/duration/timing/severity/associated sxs/prior Treatment) Patient is a 81 y.o. female presenting with fall. The history is provided by the nursing home (pt fell at rest home and hit her head.  no loc.).  Fall This is a new problem. The current episode started 6 to 12 hours ago. The problem occurs constantly. The problem has not changed since onset.Pertinent negatives include no chest pain, no abdominal pain and no headaches. Nothing aggravates the symptoms. Nothing relieves the symptoms.    Past Medical History  Diagnosis Date  . CAD (coronary artery disease)     a. 2004 Cath/PCI with stenting x 1 in [place].;  [place] 6/14:normal study, no ischemia, EF 79%  . Hypertension   . Hyperlipemia   . History of tobacco abuse     a. 50 pack yr hx, quit 2004  . History of shingles     a. left leg with residual pain.  . Peripheral neuropathy   . Hard of hearing   . Fibrous breast lumps     "both sides; I did not have cancer" (05/13/2013)  . Family history of anesthesia complication     "took a long time to wake my mother up after one of her surgeries" (05/13/2013)  . Macular degeneration, wet     "both eyes" (05/13/2013)  . Type II diabetes mellitus   . Arthritis     "hands; maybe feet" (05/13/2013)  . TIA (transient ischemic attack)   . Peripheral neuropathy 05/23/2014   Past Surgical History  Procedure Laterality Date  . Tonsillectomy    . Appendectomy    . Carpal tunnel release Bilateral   . Mastectomy Bilateral 1980's    "multiple fibrous cysts; never had cancer" (05/13/2013)20  . Cesarean section  1960; 1962  . Inner ear surgery Left 1962    "put a piece of steel and a tube & a vein from my ?left hand in" (05/13/2013)  . Cataract extraction w/ intraocular lens   implant, bilateral Bilateral 2000  . Breast biopsy    . Tubal ligation  1963  . Coronary angioplasty with stent placement  2004    "1" (05/13/2013)   Family History  Problem Relation Age of Onset  . Stroke Father     deceased  . Other Mother     deceased - possibly renal failure   Social History  Substance Use Topics  . Smoking status: Former Smoker -- 1.00 packs/day for 50 years    Types: Cigarettes    Quit date: 05/12/2003  . Smokeless tobacco: Never Used  . Alcohol Use: Yes     Comment: 05/13/2013 "socially; very seldom anymore"   OB History    No data available     Review of Systems  Constitutional: Negative for appetite change and fatigue.  HENT: Negative for congestion, ear discharge and sinus pressure.   Eyes: Negative for discharge.  Respiratory: Negative for cough.   Cardiovascular: Negative for chest pain.  Gastrointestinal: Negative for abdominal pain and diarrhea.  Genitourinary: Negative for frequency and hematuria.  Musculoskeletal: Negative for back pain.  Skin: Negative for rash.  Neurological: Negative for seizures and headaches.  Psychiatric/Behavioral: Negative for hallucinations.      Allergies  Review of patient's allergies indicates no known allergies.  Home Medications  Prior to Admission medications   Medication Sig Start Date End Date Taking? Authorizing Provider  acetaminophen (TYLENOL) 500 MG tablet Take 500 mg by mouth 3 (three) times daily.    Yes Historical Provider, MD  acetaminophen (TYLENOL) 500 MG tablet Take 500 mg by mouth every 8 (eight) hours as needed for mild pain.   Yes Historical Provider, MD  aspirin 325 MG buffered tablet Take 325 mg by mouth daily.   Yes Historical Provider, MD  bimatoprost (LUMIGAN) 0.01 % SOLN Place 1 drop into both eyes daily.    Yes Historical Provider, MD  Calcium Carb-Cholecalciferol (OYSTER SHELL CALCIUM + D) 500-400 MG-UNIT TABS Take 1 tablet by mouth 3 (three) times daily.    Yes Historical  Provider, MD  capsicum oleoresin (TRIXAICIN) 0.025 % cream Apply 1 application topically 2 (two) times daily as needed (for pain).    Yes Historical Provider, MD  cloNIDine (CATAPRES) 0.1 MG tablet Take 0.1 mg by mouth 2 (two) times daily.   Yes Historical Provider, MD  clopidogrel (PLAVIX) 75 MG tablet Take 1 tablet (75 mg total) by mouth daily with breakfast. 05/14/13  Yes Nita Sells, MD  Cyanocobalamin 1000 MCG/ML KIT Inject 1 mL as directed daily. For 7 days   Yes Historical Provider, MD  docusate sodium (COLACE) 100 MG capsule Take 200 mg by mouth at bedtime.    Yes Historical Provider, MD  guaiFENesin (MUCINEX) 600 MG 12 hr tablet Take 600 mg by mouth 2 (two) times daily.   Yes Historical Provider, MD  isosorbide mononitrate (IMDUR) 30 MG 24 hr tablet Take 1 tablet (30 mg total) by mouth daily. 05/11/13  Yes Domenic Polite, MD  lisinopril (PRINIVIL,ZESTRIL) 30 MG tablet Take 30 mg by mouth daily.   Yes Historical Provider, MD  metFORMIN (GLUCOPHAGE) 1000 MG tablet Take 1,000 mg by mouth daily with breakfast.   Yes Historical Provider, MD  nebivolol (BYSTOLIC) 5 MG tablet Take 5 mg by mouth daily.   Yes Historical Provider, MD  Polyethyl Glycol-Propyl Glycol (SYSTANE ULTRA OP) Place 2 drops into both eyes at bedtime.   Yes Historical Provider, MD  polyethylene glycol (MIRALAX / GLYCOLAX) packet Take 17 g by mouth daily as needed for mild constipation.   Yes Historical Provider, MD  polyvinyl alcohol (LIQUIFILM TEARS) 1.4 % ophthalmic solution Place 2 drops into both eyes 4 (four) times daily - after meals and at bedtime.    Yes Historical Provider, MD  pregabalin (LYRICA) 150 MG capsule Take 150 mg by mouth 2 (two) times daily.    Yes Historical Provider, MD  psyllium (REGULOID) 0.52 G capsule Take 0.52 g by mouth daily.   Yes Historical Provider, MD  sertraline (ZOLOFT) 50 MG tablet Take 50 mg by mouth daily.   Yes Historical Provider, MD  simvastatin (ZOCOR) 10 MG tablet Take 15 mg by  mouth at bedtime.    Yes Historical Provider, MD  Zinc Oxide (TRIPLE PASTE) 12.8 % ointment Apply 1 application topically as needed for irritation. 11/12/14  Yes Merrily Pew, MD  ibandronate (BONIVA) 150 MG tablet Take 150 mg by mouth every 30 (thirty) days. Take in the morning with a full glass of water, on an empty stomach, and do not take anything else by mouth or lie down for the next 30 min.    Historical Provider, MD  Multiple Vitamins-Minerals (I-VITE PO) Take 1 tablet by mouth daily.    Historical Provider, MD   BP 171/70 mmHg  Pulse 66  Temp(Src) 97.9  F (36.6 C) (Oral)  Resp 18  SpO2 92% Physical Exam  Constitutional: She is oriented to person, place, and time. She appears well-developed.  HENT:  Head: Normocephalic.  Bruising around left eye  Eyes: Conjunctivae and EOM are normal. No scleral icterus.  Neck: Neck supple. No thyromegaly present.  Cardiovascular: Normal rate and regular rhythm.  Exam reveals no gallop and no friction rub.   No murmur heard. Pulmonary/Chest: No stridor. She has no wheezes. She has no rales. She exhibits no tenderness.  Abdominal: She exhibits no distension. There is no tenderness. There is no rebound.  Musculoskeletal: Normal range of motion. She exhibits no edema.  Lymphadenopathy:    She has no cervical adenopathy.  Neurological: She is oriented to person, place, and time. She exhibits normal muscle tone. Coordination normal.  Skin: No rash noted. No erythema.  Psychiatric: She has a normal mood and affect. Her behavior is normal.    ED Course  Procedures (including critical care time) Labs Review Labs Reviewed  I-STAT CHEM 8, ED - Abnormal; Notable for the following:    BUN 24 (*)    Glucose, Bld 137 (*)    All other components within normal limits    Imaging Review Ct Head Wo Contrast  09/06/2015   CLINICAL DATA:  [place] in bathroom, became dizziness and fell striking head, was on floor 5 min, no loss of consciousness, unwitnessed  fall, on Plavix, hypertension, coronary artery disease, type II diabetes mellitus  EXAM: CT HEAD WITHOUT CONTRAST  CT CERVICAL SPINE WITHOUT CONTRAST  TECHNIQUE: Multidetector CT imaging of the head and cervical spine was performed following the standard protocol without intravenous contrast. Multiplanar CT image reconstructions of the cervical spine were also generated.  COMPARISON:  06/27/2015  FINDINGS: CT HEAD FINDINGS  Generalized atrophy.  Normal ventricular morphology.  No midline shift or mass effect.  Small vessel chronic ischemic changes of deep cerebral white matter.  Old lacunar infarcts RIGHT thalamus and RIGHT basal ganglia.  No intracranial hemorrhage, mass lesion, or acute infarction.  No extra-axial fluid collections.  Visualized paranasal sinuses and mastoid air cells clear.  Hyperostosis frontalis interna.  Atherosclerotic calcifications at the carotid siphons.  CT CERVICAL SPINE FINDINGS  Atherosclerotic calcifications of the carotid systems bilaterally.  Carotid arteries extend retropharyngeal bilaterally.  Visualized skullbase intact.  Prevertebral soft tissues normal thickness.  Diffuse osseous demineralization.  Multilevel facet degenerative changes bilaterally.  Multilevel degenerative disc disease changes with disc space narrowing and endplate spur formation, severe at C4-C5, C5-C6, and C6-C7.  Vertebral body heights maintained without fracture or subluxation.  Partially calcified pannus surrounding odontoid process.  IMPRESSION: Atrophy with small vessel chronic ischemic changes of deep cerebral white matter.  Old lacunar infarct RIGHT thalamus and RIGHT basal ganglia.  No acute intracranial abnormalities.  Severe multilevel degenerative disc and facet disease changes cervical spine.  No acute cervical spine abnormalities.  Retropharyngeal position of BILATERAL carotid arteries.   Electronically Signed   By: Lavonia Dana M.D.   On: 09/06/2015 23:50   Ct Cervical Spine Wo  Contrast  09/06/2015   CLINICAL DATA:  [place] [address] in bathroom, became dizziness and fell striking head, was on floor 5 min, no loss of consciousness, unwitnessed fall, on Plavix, hypertension, coronary artery disease, type II diabetes mellitus  EXAM: CT HEAD WITHOUT CONTRAST  CT CERVICAL SPINE WITHOUT CONTRAST  TECHNIQUE: Multidetector CT imaging of the head and cervical spine was performed following the standard protocol without intravenous contrast. Multiplanar CT image reconstructions  of the cervical spine were also generated.  COMPARISON:  06/27/2015  FINDINGS: CT HEAD FINDINGS  Generalized atrophy.  Normal ventricular morphology.  No midline shift or mass effect.  Small vessel chronic ischemic changes of deep cerebral white matter.  Old lacunar infarcts RIGHT thalamus and RIGHT basal ganglia.  No intracranial hemorrhage, mass lesion, or acute infarction.  No extra-axial fluid collections.  Visualized paranasal sinuses and mastoid air cells clear.  Hyperostosis frontalis interna.  Atherosclerotic calcifications at the carotid siphons.  CT CERVICAL SPINE FINDINGS  Atherosclerotic calcifications of the carotid systems bilaterally.  Carotid arteries extend retropharyngeal bilaterally.  Visualized skullbase intact.  Prevertebral soft tissues normal thickness.  Diffuse osseous demineralization.  Multilevel facet degenerative changes bilaterally.  Multilevel degenerative disc disease changes with disc space narrowing and endplate spur formation, severe at C4-C5, C5-C6, and C6-C7.  Vertebral body heights maintained without fracture or subluxation.  Partially calcified pannus surrounding odontoid process.  IMPRESSION: Atrophy with small vessel chronic ischemic changes of deep cerebral white matter.  Old lacunar infarct RIGHT thalamus and RIGHT basal ganglia.  No acute intracranial abnormalities.  Severe multilevel degenerative disc and facet disease changes cervical spine.  No acute cervical spine abnormalities.   Retropharyngeal position of BILATERAL carotid arteries.   Electronically Signed   By: Lavonia Dana M.D.   On: 09/06/2015 23:50   I have personally reviewed and evaluated these images and lab results as part of my medical decision-making.   EKG Interpretation   Date/Time:  Wednesday September 06 2015 21:33:23 EDT Ventricular Rate:  59 PR Interval:  156 QRS Duration: 97 QT Interval:  465 QTC Calculation: 461 R Axis:   38 Text Interpretation:  Sinus rhythm Confirmed by ZAMMIT  MD, JOSEPH ([id number])  on 09/06/2015 11:37:43 PM      MDM   Final diagnoses:  Head contusion, initial encounter    Ct head and c/s neg.  Pt will take tylenol and follow up with pcp as needed    Milton Ferguson, MD 09/08/15 [phone]

## 2015-09-12 ENCOUNTER — Emergency Department (HOSPITAL_COMMUNITY)
Admission: EM | Admit: 2015-09-12 | Discharge: 2015-09-12 | Disposition: A | Discharge status: Skilled Nursing Facility | Payer: Medicare PPO | Attending: Emergency Medicine | Admitting: Emergency Medicine

## 2015-09-12 ENCOUNTER — Emergency Department (HOSPITAL_COMMUNITY): Payer: Medicare PPO

## 2015-09-12 ENCOUNTER — Encounter (HOSPITAL_COMMUNITY): Payer: Self-pay | Admitting: Emergency Medicine

## 2015-09-12 DIAGNOSIS — W01198A Fall on same level from slipping, tripping and stumbling with subsequent striking against other object, initial encounter: Secondary | ICD-10-CM | POA: Insufficient documentation

## 2015-09-12 DIAGNOSIS — I10 Essential (primary) hypertension: Secondary | ICD-10-CM | POA: Insufficient documentation

## 2015-09-12 DIAGNOSIS — M1990 Unspecified osteoarthritis, unspecified site: Secondary | ICD-10-CM | POA: Diagnosis not present

## 2015-09-12 DIAGNOSIS — Y9289 Other specified places as the place of occurrence of the external cause: Secondary | ICD-10-CM | POA: Diagnosis not present

## 2015-09-12 DIAGNOSIS — Z7902 Long term (current) use of antithrombotics/antiplatelets: Secondary | ICD-10-CM | POA: Diagnosis not present

## 2015-09-12 DIAGNOSIS — Y9389 Activity, other specified: Secondary | ICD-10-CM | POA: Diagnosis not present

## 2015-09-12 DIAGNOSIS — Z8619 Personal history of other infectious and parasitic diseases: Secondary | ICD-10-CM | POA: Diagnosis not present

## 2015-09-12 DIAGNOSIS — Z7982 Long term (current) use of aspirin: Secondary | ICD-10-CM | POA: Insufficient documentation

## 2015-09-12 DIAGNOSIS — Z9861 Coronary angioplasty status: Secondary | ICD-10-CM | POA: Insufficient documentation

## 2015-09-12 DIAGNOSIS — Z7901 Long term (current) use of anticoagulants: Secondary | ICD-10-CM | POA: Insufficient documentation

## 2015-09-12 DIAGNOSIS — H9190 Unspecified hearing loss, unspecified ear: Secondary | ICD-10-CM | POA: Insufficient documentation

## 2015-09-12 DIAGNOSIS — S0990XA Unspecified injury of head, initial encounter: Secondary | ICD-10-CM | POA: Diagnosis present

## 2015-09-12 DIAGNOSIS — I2510 Atherosclerotic heart disease of native coronary artery without angina pectoris: Secondary | ICD-10-CM | POA: Insufficient documentation

## 2015-09-12 DIAGNOSIS — Z79899 Other long term (current) drug therapy: Secondary | ICD-10-CM | POA: Diagnosis not present

## 2015-09-12 DIAGNOSIS — W19XXXA Unspecified fall, initial encounter: Secondary | ICD-10-CM

## 2015-09-12 DIAGNOSIS — Y998 Other external cause status: Secondary | ICD-10-CM | POA: Insufficient documentation

## 2015-09-12 DIAGNOSIS — Z87891 Personal history of nicotine dependence: Secondary | ICD-10-CM | POA: Diagnosis not present

## 2015-09-12 DIAGNOSIS — E119 Type 2 diabetes mellitus without complications: Secondary | ICD-10-CM | POA: Diagnosis not present

## 2015-09-12 DIAGNOSIS — Z8673 Personal history of transient ischemic attack (TIA), and cerebral infarction without residual deficits: Secondary | ICD-10-CM | POA: Insufficient documentation

## 2015-09-12 DIAGNOSIS — S0093XA Contusion of unspecified part of head, initial encounter: Secondary | ICD-10-CM | POA: Insufficient documentation

## 2015-09-12 DIAGNOSIS — E785 Hyperlipidemia, unspecified: Secondary | ICD-10-CM | POA: Insufficient documentation

## 2015-09-12 NOTE — ED Notes (Signed)
Bed: WA03 Expected date:  Expected time:  Means of arrival:  Comments: EMS- 62s F, fall/LSB/Hx of Dementia

## 2015-09-12 NOTE — Progress Notes (Signed)
Patient presents to ED with fall with minor head injury.  Patient was seen six days ago in the ED for same.  EDCM spoke to patient at bedside.  Patient is hard of hearing.  Patient reports she is from ALF.  She reports she has someone come to her every morning to assist with ADL's and medications.  She reports she receives PT at the facility.  Patient reports she has a walker.  Patient reports she is comfortable going back to facility.  EDCM called [facility] and spoke to [employer].  Artist Pais confirms patient receives therapy, patient has a walker and a wheelchair.  Artist Pais reports they are having a careplan meeting with the patient's family on Monday or Tuesday of next week to discuss patient's frequent falls and possibly sending patient to a higher level of care.  Patient is also seen by psych doctor.  Artist Pais reports patient is also receiving Palliative Care.  Patient to be discharged back to facility this evening.  No further EDCM needs at this time.

## 2015-09-12 NOTE — Discharge Instructions (Signed)
Follow up with your family md this week °

## 2015-09-12 NOTE — ED Notes (Signed)
Pt transported by [facility] from [facility] [address] [employer] for fall.  EMS reports patient has fallen three times in the past week.  Pt last fell today at approx 1330.  Pt fell forward and hit head on cardboard box.  Pt is not taking blood thinners.  Denies LOC.  Unwitnessed fall.  Pt c/o lower back pain from previous fall.  Denies other complaints.  Pt has bruising to face from previous falls.

## 2015-09-12 NOTE — ED Provider Notes (Signed)
CSN: [id number]     Arrival date & time 09/12/15  1423 History   First MD Initiated Contact with Patient 09/12/15 1515     Chief Complaint  Patient presents with  . Fall     (Consider location/radiation/quality/duration/timing/severity/associated sxs/prior Treatment) Patient is a 81 y.o. female presenting with fall. The history is provided by the EMS personnel (Patient had a fall today with no loss of consciousness at the nursing home. Patient states that she lost her balance.).  Fall This is a chronic problem. The current episode started 6 to 12 hours ago. The problem occurs every several days. The problem has not changed since onset.Pertinent negatives include no chest pain, no abdominal pain and no headaches. Nothing aggravates the symptoms. Nothing relieves the symptoms.    Past Medical History  Diagnosis Date  . CAD (coronary artery disease)     a. 2004 Cath/PCI with stenting x 1 in [place].;  Lexiscan Myoview 6/14:normal study, no ischemia, EF 79%  . Hypertension   . Hyperlipemia   . History of tobacco abuse     a. 50 pack yr hx, quit 2004  . History of shingles     a. left leg with residual pain.  . Peripheral neuropathy   . Hard of hearing   . Fibrous breast lumps     "both sides; I did not have cancer" (05/13/2013)  . Family history of anesthesia complication     "took a long time to wake my mother up after one of her surgeries" (05/13/2013)  . Macular degeneration, wet     "both eyes" (05/13/2013)  . Type II diabetes mellitus   . Arthritis     "hands; maybe feet" (05/13/2013)  . TIA (transient ischemic attack)   . Peripheral neuropathy 05/23/2014   Past Surgical History  Procedure Laterality Date  . Tonsillectomy    . Appendectomy    . Carpal tunnel release Bilateral   . Mastectomy Bilateral 1980's    "multiple fibrous cysts; never had cancer" (05/13/2013)20  . Cesarean section  1960; 1962  . Inner ear surgery Left 1962    "put a piece of steel and a tube & a vein  from my ?left hand in" (05/13/2013)  . Cataract extraction w/ intraocular lens  implant, bilateral Bilateral 2000  . Breast biopsy    . Tubal ligation  1963  . Coronary angioplasty with stent placement  2004    "1" (05/13/2013)   Family History  Problem Relation Age of Onset  . Stroke Father     deceased  . Other Mother     deceased - possibly renal failure   Social History  Substance Use Topics  . Smoking status: Former Smoker -- 1.00 packs/day for 50 years    Types: Cigarettes    Quit date: 05/12/2003  . Smokeless tobacco: Never Used  . Alcohol Use: Yes     Comment: 05/13/2013 "socially; very seldom anymore"   OB History    No data available     Review of Systems  Constitutional: Negative for appetite change and fatigue.  HENT: Negative for congestion, ear discharge and sinus pressure.   Eyes: Negative for discharge.  Respiratory: Negative for cough.   Cardiovascular: Negative for chest pain.  Gastrointestinal: Negative for abdominal pain and diarrhea.  Genitourinary: Negative for frequency and hematuria.  Musculoskeletal: Negative for back pain.  Skin: Negative for rash.  Neurological: Negative for seizures and headaches.  Psychiatric/Behavioral: Negative for hallucinations.      Allergies  Review of patient's allergies indicates no known allergies.  Home Medications   Prior to Admission medications   Medication Sig Start Date End Date Taking? Authorizing Provider  acetaminophen (TYLENOL) 500 MG tablet Take 500 mg by mouth 3 (three) times daily.    Yes Historical Provider, MD  acetaminophen (TYLENOL) 500 MG tablet Take 500 mg by mouth every 8 (eight) hours as needed for mild pain.   Yes Historical Provider, MD  aspirin 325 MG buffered tablet Take 325 mg by mouth daily.   Yes Historical Provider, MD  bimatoprost (LUMIGAN) 0.01 % SOLN Place 1 drop into both eyes daily.    Yes Historical Provider, MD  Calcium Carb-Cholecalciferol (OYSTER SHELL CALCIUM + D) 500-400  MG-UNIT TABS Take 1 tablet by mouth 3 (three) times daily.    Yes Historical Provider, MD  capsicum oleoresin (TRIXAICIN) 0.025 % cream Apply 1 application topically 2 (two) times daily as needed (for pain).    Yes Historical Provider, MD  cloNIDine (CATAPRES) 0.1 MG tablet Take 0.1 mg by mouth 2 (two) times daily.   Yes Historical Provider, MD  clopidogrel (PLAVIX) 75 MG tablet Take 1 tablet (75 mg total) by mouth daily with breakfast. 05/14/13  Yes Rhetta Mura, MD  docusate sodium (COLACE) 100 MG capsule Take 200 mg by mouth at bedtime.    Yes Historical Provider, MD  guaiFENesin (MUCINEX) 600 MG 12 hr tablet Take 600 mg by mouth 2 (two) times daily.   Yes Historical Provider, MD  ibandronate (BONIVA) 150 MG tablet Take 150 mg by mouth every 30 (thirty) days. Take in the morning with a full glass of water, on an empty stomach, and do not take anything else by mouth or lie down for the next 30 min.   Yes Historical Provider, MD  isosorbide mononitrate (IMDUR) 30 MG 24 hr tablet Take 1 tablet (30 mg total) by mouth daily. 05/11/13  Yes Zannie Cove, MD  lisinopril (PRINIVIL,ZESTRIL) 30 MG tablet Take 30 mg by mouth daily.   Yes Historical Provider, MD  metFORMIN (GLUCOPHAGE) 1000 MG tablet Take 1,000 mg by mouth daily with breakfast.   Yes Historical Provider, MD  Multiple Vitamins-Minerals (I-VITE PO) Take 1 tablet by mouth daily.   Yes Historical Provider, MD  nebivolol (BYSTOLIC) 5 MG tablet Take 5 mg by mouth daily.   Yes Historical Provider, MD  Polyethyl Glycol-Propyl Glycol (SYSTANE ULTRA OP) Place 2 drops into both eyes at bedtime.   Yes Historical Provider, MD  polyethylene glycol (MIRALAX / GLYCOLAX) packet Take 17 g by mouth daily as needed for mild constipation.   Yes Historical Provider, MD  polyvinyl alcohol (LIQUIFILM TEARS) 1.4 % ophthalmic solution Place 2 drops into both eyes 4 (four) times daily - after meals and at bedtime.    Yes Historical Provider, MD  pregabalin  (LYRICA) 150 MG capsule Take 150 mg by mouth 2 (two) times daily.    Yes Historical Provider, MD  psyllium (REGULOID) 0.52 G capsule Take 0.52 g by mouth daily.   Yes Historical Provider, MD  sertraline (ZOLOFT) 50 MG tablet Take 50 mg by mouth daily.   Yes Historical Provider, MD  simvastatin (ZOCOR) 10 MG tablet Take 15 mg by mouth at bedtime.    Yes Historical Provider, MD  Zinc Oxide (TRIPLE PASTE) 12.8 % ointment Apply 1 application topically as needed for irritation. 11/12/14  Yes Jason Mesner, MD   BP 187/84 mmHg  Pulse 53  Temp(Src) 97.4 F (36.3 C) (Oral)  Resp 16  SpO2 95% Physical Exam  Constitutional: She is oriented to person, place, and time. She appears well-developed.  HENT:  Head: Normocephalic.  Bruising around both eyes.  Eyes: Conjunctivae and EOM are normal. No scleral icterus.  Neck: Neck supple. No thyromegaly present.  Cardiovascular: Normal rate and regular rhythm.  Exam reveals no gallop and no friction rub.   No murmur heard. Pulmonary/Chest: No stridor. She has no wheezes. She has no rales. She exhibits no tenderness.  Abdominal: She exhibits no distension. There is no tenderness. There is no rebound.  Musculoskeletal: Normal range of motion. She exhibits no edema.  Minor tenderness to both hips.  Lymphadenopathy:    She has no cervical adenopathy.  Neurological: She is alert and oriented to person, place, and time. She exhibits normal muscle tone. Coordination normal.  Skin: No rash noted. No erythema.  Psychiatric: She has a normal mood and affect. Her behavior is normal.    ED Course  Procedures (including critical care time) Labs Review Labs Reviewed - No data to display  Imaging Review Dg Pelvis 1-2 Views  09/12/2015   CLINICAL DATA:  Status post fall x3 over the past week. Last fall occurred today at 1:30 p.m. Low back pain. Initial encounter.  EXAM: PELVIS - 1-2 VIEW  COMPARISON:  None.  FINDINGS: No acute bony or joint abnormality is  identified. Bilateral hip degenerative change is seen. Soft tissues are unremarkable.  IMPRESSION: No acute abnormality.   Electronically Signed   By: Drusilla Kanner M.D.   On: 09/12/2015 15:59   Ct Head Wo Contrast  09/12/2015   CLINICAL DATA:  Pain following fall  EXAM: CT HEAD WITHOUT CONTRAST  CT CERVICAL SPINE WITHOUT CONTRAST  TECHNIQUE: Multidetector CT imaging of the head and cervical spine was performed following the standard protocol without intravenous contrast. Multiplanar CT image reconstructions of the cervical spine were also generated.  COMPARISON:  CT head and CT cervical spine September 06, 2015  FINDINGS: CT HEAD FINDINGS  Mild diffuse atrophy is stable. There is no intracranial mass, hemorrhage, extra-axial fluid collection, or midline shift. Small vessel disease throughout the centra semiovale bilaterally is stable. There is evidence of a prior infarct in the right anterior lentiform nucleus involving a portion of the anterior limb of the right internal capsule. A small infarct in the anterior right thalamus is also noted. No new gray-white compartment lesion. No acute infarct evident. Bony calvarium appears intact. The mastoid air cells are clear.  CT CERVICAL SPINE FINDINGS  There is no apparent fracture or spondylolisthesis. Prevertebral soft tissues and predental space regions are normal. There is marked disc space narrowing at C4-5, C5-6, and C6-7, stable. There is moderate narrowing at C3-4 and C7-T1. There is facet hypertrophy at essentially all levels bilaterally with exit foraminal narrowing at most levels bilaterally, stable. No frank disc extrusion or high-grade stenosis. There are prominent anterior osteophytes at C4, C5, C6, C7 with moderate posterior osteophytes at these levels as well, stable.  There is calcification in both carotid arteries. There is pannus at C2 posteriorly, partially calcified and stable, not causing spinal stenosis.  IMPRESSION: CT head: Atrophy with  supratentorial small vessel disease, stable. 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 No acute infarct evident. No hemorrhage or mass. No extra-axial fluid collection.  CT cervical spine: No apparent fracture or spondylolisthesis. Multilevel osteoarthritic change, stable. Calcification in both carotid arteries. No appreciable change compared to recent prior study.   Electronically Signed  By: Bretta Bang III M.D.   On: 09/12/2015 16:26   Ct Cervical Spine Wo Contrast  09/12/2015   CLINICAL DATA:  Pain following fall  EXAM: CT HEAD WITHOUT CONTRAST  CT CERVICAL SPINE WITHOUT CONTRAST  TECHNIQUE: Multidetector CT imaging of the head and cervical spine was performed following the standard protocol without intravenous contrast. Multiplanar CT image reconstructions of the cervical spine were also generated.  COMPARISON:  CT head and CT cervical spine September 06, 2015  FINDINGS: CT HEAD FINDINGS  Mild diffuse atrophy is stable. There is no intracranial mass, hemorrhage, extra-axial fluid collection, or midline shift. Small vessel disease throughout the centra semiovale bilaterally is stable. There is evidence of a prior infarct in the right anterior lentiform nucleus involving a portion of the anterior limb of the right internal capsule. A small infarct in the anterior right thalamus is also noted. No new gray-white compartment lesion. No acute infarct evident. Bony calvarium appears intact. The mastoid air cells are clear.  CT CERVICAL SPINE FINDINGS  There is no apparent fracture or spondylolisthesis. Prevertebral soft tissues and predental space regions are normal. There is marked disc space narrowing at C4-5, C5-6, and C6-7, stable. There is moderate narrowing at C3-4 and C7-T1. There is facet hypertrophy at essentially all levels bilaterally with exit foraminal narrowing at most levels bilaterally, stable. No frank disc extrusion or high-grade stenosis. There are  prominent anterior osteophytes at C4, C5, C6, C7 with moderate posterior osteophytes at these levels as well, stable.  There is calcification in both carotid arteries. There is pannus at C2 posteriorly, partially calcified and stable, not causing spinal stenosis.  IMPRESSION: CT head: Atrophy with supratentorial small vessel disease, stable. 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 No acute infarct evident. No hemorrhage or mass. No extra-axial fluid collection.  CT cervical spine: No apparent fracture or spondylolisthesis. Multilevel osteoarthritic change, stable. Calcification in both carotid arteries. No appreciable change compared to recent prior study.   Electronically Signed   By: Bretta Bang III M.D.   On: 09/12/2015 16:26   I have personally reviewed and evaluated these images and lab results as part of my medical decision-making.   EKG Interpretation None      MDM   Final diagnoses:  Fall, initial encounter  Head contusion, initial encounter    Fall with minor head injury. Patient will go back to the rest home. And will follow up with her family doctor. Patient may need close follow-up.    Bethann Berkshire, MD 09/12/15 [phone]

## 2015-09-12 NOTE — ED Notes (Signed)
Patient log rolled from backboard.  Patient c/o chronic lower back pain.

## 2015-09-13 ENCOUNTER — Ambulatory Visit: Payer: Medicare PPO | Admitting: Podiatry

## 2015-09-20 ENCOUNTER — Ambulatory Visit: Payer: Medicare PPO | Admitting: Podiatry

## 2015-10-04 ENCOUNTER — Ambulatory Visit (INDEPENDENT_AMBULATORY_CARE_PROVIDER_SITE_OTHER): Payer: Medicare PPO | Admitting: Podiatry

## 2015-10-04 ENCOUNTER — Ambulatory Visit: Payer: Medicare PPO | Admitting: Podiatry

## 2015-10-04 ENCOUNTER — Encounter: Payer: Self-pay | Admitting: Podiatry

## 2015-10-04 DIAGNOSIS — M79676 Pain in unspecified toe(s): Secondary | ICD-10-CM

## 2015-10-04 DIAGNOSIS — B351 Tinea unguium: Secondary | ICD-10-CM

## 2015-10-09 NOTE — Progress Notes (Signed)
Subjective:     Patient ID: Judy Perez, female   DOB: [birth year], 81 y.o.   MRN: [id number]  HPI patient presents with thick yellow brittle nailbeds 1-5 both feet that are bothersome and she cannot cut   Review of Systems     Objective:   Physical Exam Neurovascular status intact thick yellow brittle nailbeds 1-5 with pain    Assessment:     Mycotic nail infection with pain 1-5 both feet    Plan:     Debride painful nailbeds 1-5 both feet with no iatrogenic bleeding noted

## 2015-11-14 ENCOUNTER — Emergency Department (HOSPITAL_COMMUNITY): Payer: Medicare PPO

## 2015-11-14 ENCOUNTER — Encounter (HOSPITAL_COMMUNITY): Payer: Self-pay | Admitting: Emergency Medicine

## 2015-11-14 ENCOUNTER — Inpatient Hospital Stay (HOSPITAL_COMMUNITY)
Admission: EM | Admit: 2015-11-14 | Discharge: 2015-11-17 | DRG: 066 | Disposition: A | Discharge status: Skilled Nursing Facility | Payer: Medicare PPO | Source: Skilled Nursing Facility | Attending: Internal Medicine | Admitting: Internal Medicine

## 2015-11-14 DIAGNOSIS — Z9842 Cataract extraction status, left eye: Secondary | ICD-10-CM

## 2015-11-14 DIAGNOSIS — M25512 Pain in left shoulder: Secondary | ICD-10-CM | POA: Diagnosis present

## 2015-11-14 DIAGNOSIS — E1165 Type 2 diabetes mellitus with hyperglycemia: Secondary | ICD-10-CM

## 2015-11-14 DIAGNOSIS — I63311 Cerebral infarction due to thrombosis of right middle cerebral artery: Secondary | ICD-10-CM | POA: Diagnosis present

## 2015-11-14 DIAGNOSIS — M810 Age-related osteoporosis without current pathological fracture: Secondary | ICD-10-CM | POA: Diagnosis present

## 2015-11-14 DIAGNOSIS — I63431 Cerebral infarction due to embolism of right posterior cerebral artery: Secondary | ICD-10-CM | POA: Diagnosis present

## 2015-11-14 DIAGNOSIS — I160 Hypertensive urgency: Secondary | ICD-10-CM | POA: Diagnosis present

## 2015-11-14 DIAGNOSIS — I129 Hypertensive chronic kidney disease with stage 1 through stage 4 chronic kidney disease, or unspecified chronic kidney disease: Secondary | ICD-10-CM | POA: Diagnosis present

## 2015-11-14 DIAGNOSIS — Z7984 Long term (current) use of oral hypoglycemic drugs: Secondary | ICD-10-CM | POA: Diagnosis not present

## 2015-11-14 DIAGNOSIS — H5347 Heteronymous bilateral field defects: Secondary | ICD-10-CM | POA: Diagnosis present

## 2015-11-14 DIAGNOSIS — Z87891 Personal history of nicotine dependence: Secondary | ICD-10-CM

## 2015-11-14 DIAGNOSIS — W1811XA Fall from or off toilet without subsequent striking against object, initial encounter: Secondary | ICD-10-CM | POA: Diagnosis present

## 2015-11-14 DIAGNOSIS — E1122 Type 2 diabetes mellitus with diabetic chronic kidney disease: Secondary | ICD-10-CM | POA: Diagnosis present

## 2015-11-14 DIAGNOSIS — M25562 Pain in left knee: Secondary | ICD-10-CM | POA: Diagnosis present

## 2015-11-14 DIAGNOSIS — H9190 Unspecified hearing loss, unspecified ear: Secondary | ICD-10-CM | POA: Diagnosis present

## 2015-11-14 DIAGNOSIS — M25572 Pain in left ankle and joints of left foot: Secondary | ICD-10-CM | POA: Diagnosis present

## 2015-11-14 DIAGNOSIS — E669 Obesity, unspecified: Secondary | ICD-10-CM | POA: Diagnosis present

## 2015-11-14 DIAGNOSIS — N189 Chronic kidney disease, unspecified: Secondary | ICD-10-CM | POA: Diagnosis present

## 2015-11-14 DIAGNOSIS — M25511 Pain in right shoulder: Secondary | ICD-10-CM | POA: Diagnosis present

## 2015-11-14 DIAGNOSIS — Z7982 Long term (current) use of aspirin: Secondary | ICD-10-CM

## 2015-11-14 DIAGNOSIS — Z9841 Cataract extraction status, right eye: Secondary | ICD-10-CM | POA: Diagnosis not present

## 2015-11-14 DIAGNOSIS — E1142 Type 2 diabetes mellitus with diabetic polyneuropathy: Secondary | ICD-10-CM | POA: Diagnosis present

## 2015-11-14 DIAGNOSIS — E119 Type 2 diabetes mellitus without complications: Secondary | ICD-10-CM

## 2015-11-14 DIAGNOSIS — H409 Unspecified glaucoma: Secondary | ICD-10-CM | POA: Diagnosis present

## 2015-11-14 DIAGNOSIS — F0390 Unspecified dementia without behavioral disturbance: Secondary | ICD-10-CM | POA: Diagnosis present

## 2015-11-14 DIAGNOSIS — I639 Cerebral infarction, unspecified: Secondary | ICD-10-CM | POA: Diagnosis present

## 2015-11-14 DIAGNOSIS — I10 Essential (primary) hypertension: Secondary | ICD-10-CM | POA: Insufficient documentation

## 2015-11-14 DIAGNOSIS — Z6832 Body mass index (BMI) 32.0-32.9, adult: Secondary | ICD-10-CM

## 2015-11-14 DIAGNOSIS — G459 Transient cerebral ischemic attack, unspecified: Secondary | ICD-10-CM | POA: Diagnosis present

## 2015-11-14 DIAGNOSIS — E1139 Type 2 diabetes mellitus with other diabetic ophthalmic complication: Secondary | ICD-10-CM | POA: Diagnosis present

## 2015-11-14 DIAGNOSIS — I2510 Atherosclerotic heart disease of native coronary artery without angina pectoris: Secondary | ICD-10-CM | POA: Diagnosis present

## 2015-11-14 DIAGNOSIS — Z961 Presence of intraocular lens: Secondary | ICD-10-CM | POA: Diagnosis present

## 2015-11-14 DIAGNOSIS — Z955 Presence of coronary angioplasty implant and graft: Secondary | ICD-10-CM | POA: Diagnosis not present

## 2015-11-14 DIAGNOSIS — R29707 NIHSS score 7: Secondary | ICD-10-CM | POA: Diagnosis not present

## 2015-11-14 DIAGNOSIS — Y92002 Bathroom of unspecified non-institutional (private) residence single-family (private) house as the place of occurrence of the external cause: Secondary | ICD-10-CM | POA: Diagnosis not present

## 2015-11-14 DIAGNOSIS — I63111 Cerebral infarction due to embolism of right vertebral artery: Secondary | ICD-10-CM | POA: Diagnosis not present

## 2015-11-14 DIAGNOSIS — W19XXXA Unspecified fall, initial encounter: Secondary | ICD-10-CM

## 2015-11-14 DIAGNOSIS — E785 Hyperlipidemia, unspecified: Secondary | ICD-10-CM | POA: Diagnosis present

## 2015-11-14 HISTORY — DX: Unspecified glaucoma: H40.9

## 2015-11-14 HISTORY — DX: Age-related osteoporosis without current pathological fracture: M81.0

## 2015-11-14 HISTORY — DX: Disorder of kidney and ureter, unspecified: N28.9

## 2015-11-14 LAB — GLUCOSE, CAPILLARY: GLUCOSE-CAPILLARY: 189 mg/dL — AB (ref 65–99)

## 2015-11-14 LAB — BASIC METABOLIC PANEL
Anion gap: 7 (ref 5–15)
BUN: 23 mg/dL — AB (ref 6–20)
CO2: 31 mmol/L (ref 22–32)
CREATININE: 1.07 mg/dL — AB (ref 0.44–1.00)
Calcium: 10 mg/dL (ref 8.9–10.3)
Chloride: 106 mmol/L (ref 101–111)
GFR calc Af Amer: 54 mL/min — ABNORMAL LOW (ref >60)
GFR calc non Af Amer: 47 mL/min — ABNORMAL LOW (ref >60)
Glucose, Bld: 114 mg/dL — ABNORMAL HIGH (ref 65–99)
Potassium: 4.3 mmol/L (ref 3.5–5.1)
SODIUM: 144 mmol/L (ref 135–145)

## 2015-11-14 LAB — MRSA PCR SCREENING: MRSA BY PCR: NEGATIVE

## 2015-11-14 LAB — CBC
HCT: 42.4 % (ref 36.0–46.0)
Hemoglobin: 14.1 g/dL (ref 12.0–15.0)
MCH: 31.6 pg (ref 26.0–34.0)
MCHC: 33.3 g/dL (ref 30.0–36.0)
MCV: 95.1 fL (ref 78.0–100.0)
PLATELETS: 230 10*3/uL (ref 150–400)
RBC: 4.46 MIL/uL (ref 3.87–5.11)
RDW: 12.5 % (ref 11.5–15.5)
WBC: 5.8 10*3/uL (ref 4.0–10.5)

## 2015-11-14 LAB — CBG MONITORING, ED
Glucose-Capillary: 102 mg/dL — ABNORMAL HIGH (ref 65–99)
Glucose-Capillary: 125 mg/dL — ABNORMAL HIGH (ref 65–99)

## 2015-11-14 MED ORDER — INSULIN ASPART 100 UNIT/ML ~~LOC~~ SOLN
0.0000 [IU] | Freq: Three times a day (TID) | SUBCUTANEOUS | Status: DC
  Administered 2015-11-15: 2 [IU] via SUBCUTANEOUS
  Administered 2015-11-15 – 2015-11-16 (×4): 1 [IU] via SUBCUTANEOUS
  Administered 2015-11-17: 2 [IU] via SUBCUTANEOUS

## 2015-11-14 MED ORDER — HYDRALAZINE HCL 20 MG/ML IJ SOLN
10.0000 mg | Freq: Once | INTRAMUSCULAR | Status: AC
  Administered 2015-11-14: 10 mg via INTRAVENOUS
  Filled 2015-11-14: qty 1

## 2015-11-14 MED ORDER — PREGABALIN 75 MG PO CAPS
150.0000 mg | ORAL_CAPSULE | Freq: Two times a day (BID) | ORAL | Status: DC
  Administered 2015-11-14 – 2015-11-17 (×5): 150 mg via ORAL
  Filled 2015-11-14 (×7): qty 2

## 2015-11-14 MED ORDER — STROKE: EARLY STAGES OF RECOVERY BOOK
Freq: Once | Status: AC
  Administered 2015-11-14: 1

## 2015-11-14 MED ORDER — LATANOPROST 0.005 % OP SOLN
1.0000 [drp] | Freq: Every day | OPHTHALMIC | Status: DC
  Administered 2015-11-14: 1 [drp] via OPHTHALMIC
  Filled 2015-11-14: qty 2.5

## 2015-11-14 MED ORDER — ENOXAPARIN SODIUM 40 MG/0.4ML ~~LOC~~ SOLN
40.0000 mg | SUBCUTANEOUS | Status: DC
  Administered 2015-11-14 – 2015-11-16 (×3): 40 mg via SUBCUTANEOUS
  Filled 2015-11-14 (×3): qty 0.4

## 2015-11-14 MED ORDER — POLYVINYL ALCOHOL 1.4 % OP SOLN
2.0000 [drp] | Freq: Four times a day (QID) | OPHTHALMIC | Status: DC
Start: 2015-11-14 — End: 2015-11-17
  Administered 2015-11-14 – 2015-11-17 (×12): 2 [drp] via OPHTHALMIC
  Filled 2015-11-14: qty 15

## 2015-11-14 MED ORDER — SIMVASTATIN 5 MG PO TABS
15.0000 mg | ORAL_TABLET | Freq: Every day | ORAL | Status: DC
  Administered 2015-11-14 – 2015-11-15 (×2): 15 mg via ORAL
  Filled 2015-11-14 (×3): qty 3

## 2015-11-14 MED ORDER — HYDROCODONE-ACETAMINOPHEN 5-325 MG PO TABS
2.0000 | ORAL_TABLET | Freq: Once | ORAL | Status: AC
  Administered 2015-11-14: 2 via ORAL
  Filled 2015-11-14: qty 2

## 2015-11-14 MED ORDER — LORAZEPAM 1 MG PO TABS
1.0000 mg | ORAL_TABLET | Freq: Once | ORAL | Status: AC
  Administered 2015-11-14: 1 mg via ORAL
  Filled 2015-11-14: qty 1

## 2015-11-14 MED ORDER — DIPHENHYDRAMINE HCL 25 MG PO CAPS
50.0000 mg | ORAL_CAPSULE | Freq: Once | ORAL | Status: AC
  Administered 2015-11-14: 50 mg via ORAL
  Filled 2015-11-14: qty 2

## 2015-11-14 MED ORDER — DOCUSATE SODIUM 100 MG PO CAPS
200.0000 mg | ORAL_CAPSULE | Freq: Every day | ORAL | Status: DC
  Administered 2015-11-14 – 2015-11-15 (×2): 200 mg via ORAL
  Filled 2015-11-14 (×3): qty 2

## 2015-11-14 MED ORDER — ASPIRIN BUFFERED 325 MG PO TABS: 325.0000 mg | ORAL_TABLET | Freq: Every day | ORAL | Status: DC

## 2015-11-14 MED ORDER — SERTRALINE HCL 50 MG PO TABS
50.0000 mg | ORAL_TABLET | Freq: Every day | ORAL | Status: DC
  Administered 2015-11-14 – 2015-11-17 (×4): 50 mg via ORAL
  Filled 2015-11-14 (×5): qty 1

## 2015-11-14 MED ORDER — HYDROCODONE-ACETAMINOPHEN 5-325 MG PO TABS
1.0000 | ORAL_TABLET | Freq: Once | ORAL | Status: AC
  Administered 2015-11-14: 1 via ORAL
  Filled 2015-11-14: qty 1

## 2015-11-14 MED ORDER — DONEPEZIL HCL 5 MG PO TABS
5.0000 mg | ORAL_TABLET | Freq: Every day | ORAL | Status: DC
  Administered 2015-11-14 – 2015-11-15 (×2): 5 mg via ORAL
  Filled 2015-11-14 (×3): qty 1

## 2015-11-14 MED ORDER — ASPIRIN EC 325 MG PO TBEC
325.0000 mg | DELAYED_RELEASE_TABLET | Freq: Every day | ORAL | Status: DC
  Administered 2015-11-14 – 2015-11-17 (×4): 325 mg via ORAL
  Filled 2015-11-14 (×5): qty 1

## 2015-11-14 MED ORDER — DIPHENHYDRAMINE HCL 25 MG PO CAPS: 25.0000 mg | ORAL_CAPSULE | ORAL | Status: DC | PRN

## 2015-11-14 NOTE — ED Notes (Signed)
Patient transported to MRI 

## 2015-11-14 NOTE — ED Notes (Signed)
[facility] at bedside 

## 2015-11-14 NOTE — H&P (Signed)
[facility] History and Physical  Judy Perez ZOX:[id number] DOB: March 13, 1930 DOA: 11/14/2015  Referring physician: EDP PCP: Florentina Jenny, MD   Chief Complaint: brought in fro ALF for a fall.   HPI: Judy Perez is a 81 y.o. female with h/o CAD, hypertension, type 2 DM, peripheral neuropathy, TIA, was brought in ED, after a fall at SNF yesterday. On arrival to ED, she underwent trauma evaluation, was found to have a sub acute stroke in the right posterior parietal lobe. It was followed by an MRI of the brain that showed 4 to 5 cm infarct in the posterior right hemisphere, right MCA/PCA watershed area. Neurology consulted and recommendations given. Plan to transfer the patient to [place] for admission and for further evaluation.    Review of Systems:  Could not be obtained. Confused.   Past Medical History  Diagnosis Date  . CAD (coronary artery disease)     a. 2004 Cath/PCI with stenting x 1 in [place].;  Lexiscan Myoview 6/14:normal study, no ischemia, EF 79%  . Hypertension   . Hyperlipemia   . History of tobacco abuse     a. 50 pack yr hx, quit 2004  . History of shingles     a. left leg with residual pain.  . Peripheral neuropathy (HCC)   . Hard of hearing   . Fibrous breast lumps     "both sides; I did not have cancer" (05/13/2013)  . Family history of anesthesia complication     "took a long time to wake my mother up after one of her surgeries" (05/13/2013)  . Macular degeneration, wet (HCC)     "both eyes" (05/13/2013)  . Type II diabetes mellitus (HCC)   . Arthritis     "hands; maybe feet" (05/13/2013)  . TIA (transient ischemic attack)   . Peripheral neuropathy (HCC) 05/23/2014  . Renal disorder     Chronic kidney disease  . Osteoporosis   . Glaucoma    Past Surgical History  Procedure Laterality Date  . Tonsillectomy    . Appendectomy    . Carpal tunnel release Bilateral   . Mastectomy Bilateral 1980's    "multiple fibrous cysts; never had  cancer" (05/13/2013)20  . Cesarean section  1960; 1962  . Inner ear surgery Left 1962    "put a piece of steel and a tube & a vein from my ?left hand in" (05/13/2013)  . Cataract extraction w/ intraocular lens  implant, bilateral Bilateral 2000  . Breast biopsy    . Tubal ligation  1963  . Coronary angioplasty with stent placement  2004    "1" (05/13/2013)   Social History:  reports that she quit smoking about 12 years ago. Her smoking use included Cigarettes. She has a 50 pack-year smoking history. She has never used smokeless tobacco. She reports that she drinks alcohol. She reports that she does not use illicit drugs.  No Known Allergies  Family History  Problem Relation Age of Onset  . Stroke Father     deceased  . Other Mother     deceased - possibly renal failure    Prior to Admission medications   Medication Sig Start Date End Date Taking? Authorizing Provider  acetaminophen (TYLENOL) 500 MG tablet Take 1,000 mg by mouth 2 (two) times daily.    Yes Historical Provider, MD  aspirin 325 MG buffered tablet Take 325 mg by mouth daily.   Yes Historical Provider, MD  bimatoprost (LUMIGAN) 0.01 % SOLN Place 1 drop into both  eyes daily.    Yes Historical Provider, MD  Calcium Carb-Cholecalciferol (OYSTER SHELL CALCIUM + D) 500-400 MG-UNIT TABS Take 1 tablet by mouth 3 (three) times daily.    Yes Historical Provider, MD  capsicum oleoresin (TRIXAICIN) 0.025 % cream Apply 1 application topically every 12 (twelve) hours as needed (for pain). Apply to feet   Yes Historical Provider, MD  cloNIDine (CATAPRES) 0.1 MG tablet Take 0.1 mg by mouth 2 (two) times daily.   Yes Historical Provider, MD  docusate sodium (COLACE) 100 MG capsule Take 200 mg by mouth at bedtime.    Yes Historical Provider, MD  donepezil (ARICEPT) 5 MG tablet Take 5 mg by mouth at bedtime.   Yes Historical Provider, MD  ibandronate (BONIVA) 150 MG tablet Take 150 mg by mouth every 30 (thirty) days. Take in the morning with a  full glass of water, on an empty stomach, and do not take anything else by mouth or lie down for the next 30 min.   Yes Historical Provider, MD  isosorbide mononitrate (IMDUR) 30 MG 24 hr tablet Take 1 tablet (30 mg total) by mouth daily. 05/11/13  Yes Zannie Cove, MD  lisinopril (PRINIVIL,ZESTRIL) 40 MG tablet Take 40 mg by mouth daily.   Yes Historical Provider, MD  metFORMIN (GLUCOPHAGE) 1000 MG tablet Take 1,000 mg by mouth daily with breakfast.   Yes Historical Provider, MD  Multiple Vitamins-Minerals (I-VITE PO) Take 1 tablet by mouth daily.   Yes Historical Provider, MD  nebivolol (BYSTOLIC) 5 MG tablet Take 5 mg by mouth daily.   Yes Historical Provider, MD  Polyethyl Glycol-Propyl Glycol (SYSTANE ULTRA OP) Place 1 drop into both eyes 3 (three) times daily.    Yes Historical Provider, MD  polyvinyl alcohol (LIQUIFILM TEARS) 1.4 % ophthalmic solution Place 2 drops into both eyes 4 (four) times daily.    Yes Historical Provider, MD  pregabalin (LYRICA) 150 MG capsule Take 150 mg by mouth 2 (two) times daily.    Yes Historical Provider, MD  psyllium (REGULOID) 0.52 G capsule Take 0.52 g by mouth at bedtime.    Yes Historical Provider, MD  sertraline (ZOLOFT) 50 MG tablet Take 50 mg by mouth daily.   Yes Historical Provider, MD  simvastatin (ZOCOR) 10 MG tablet Take 15 mg by mouth at bedtime.    Yes Historical Provider, MD  Zinc Oxide (TRIPLE PASTE) 12.8 % ointment Apply 1 application topically as needed for irritation. Patient taking differently: Apply 1 application topically as needed for irritation. Apply to buttocks 11/12/14  Yes Marily Memos, MD  clopidogrel (PLAVIX) 75 MG tablet Take 1 tablet (75 mg total) by mouth daily with breakfast. Patient not taking: Reported on 11/14/2015 05/14/13   Rhetta Mura, MD   Physical Exam: Filed Vitals:   11/14/15 1747 11/14/15 1748 11/14/15 1805 11/14/15 1822  BP: 165/53 165/53 165/53   Pulse: 64  58 65  Temp:   98.5 F (36.9 C)   TempSrc:       Resp: 17  17   SpO2: 94%  94% 93%    Wt Readings from Last 3 Encounters:  07/18/14 79.379 kg (175 lb)  05/23/14 77.62 kg (171 lb 1.9 oz)  01/12/14 78.472 kg (173 lb)    General:  Appears calm and comfortable Eyes: PERRL, normal lids, irises & conjunctiva Neck: no LAD, masses or thyromegaly Cardiovascular: RRR, no m/r/g. No LE edema. Respiratory: CTA bilaterally, no w/r/r. Normal respiratory effort. Abdomen: soft, ntnd Skin: no rash or induration seen  on limited exam Musculoskeletal: grossly normal tone BUE/BLE Neurologic: alert, confused, able to move all extremities,. Speech normal.           Labs on Admission:  Basic Metabolic Panel:  Recent Labs Lab 11/14/15 0828  NA 144  K 4.3  CL 106  CO2 31  GLUCOSE 114*  BUN 23*  CREATININE 1.07*  CALCIUM 10.0   Liver Function Tests: No results for input(s): AST, ALT, ALKPHOS, BILITOT, PROT, ALBUMIN in the last 168 hours. No results for input(s): LIPASE, AMYLASE in the last 168 hours. No results for input(s): AMMONIA in the last 168 hours. CBC:  Recent Labs Lab 11/14/15 0828  WBC 5.8  HGB 14.1  HCT 42.4  MCV 95.1  PLT 230   Cardiac Enzymes: No results for input(s): CKTOTAL, CKMB, CKMBINDEX, TROPONINI in the last 168 hours.  BNP (last 3 results) No results for input(s): BNP in the last 8760 hours.  ProBNP (last 3 results) No results for input(s): PROBNP in the last 8760 hours.  CBG:  Recent Labs Lab 11/14/15 0829 11/14/15 1826  GLUCAP 102* 125*    Radiological Exams on Admission: Dg Pelvis 1-2 Views  11/14/2015  CLINICAL DATA:  Status post fall while standing up in the bathroom. Patient reports back and tail bone pain. EXAM: PELVIS - 1-2 VIEW COMPARISON:  AP pelvis of September 12, 2015 FINDINGS: The bones are osteopenic. The sacrum and SI joints are grossly intact. The pubic rami are intact. There is mild narrowing of the hip joint spaces bilaterally. The pubic rami are intact. The proximal femurs  are intact where visualized. There are numerous pelvic phleboliths and there are arterial calcifications. IMPRESSION: There is no acute bony abnormality of the pelvis. There is mild degenerative change of both hips. Electronically Signed   By: David  [place] M.D.   On: 11/14/2015 10:28   Dg Sacrum/coccyx  11/14/2015  CLINICAL DATA:  Pain following fall EXAM: SACRUM AND COCCYX - 2+ VIEW COMPARISON:  None. FINDINGS: Frontal and lateral views were obtained. No fracture or diastases. There is osteoarthritic change in both sacroiliac joints. Bones are somewhat osteoporotic. There is mild spondylolisthesis at L4-5, likely due to underlying spondylosis. There are scattered foci of arterial vascular calcification. IMPRESSION: No fracture or diastases. Bones osteoporotic. Mild spondylolisthesis at L4-5, likely due to underlying spondylosis. Electronically Signed   By: Bretta Bang III M.D.   On: 11/14/2015 10:29   Dg Knee 2 Views Left  11/14/2015  CLINICAL DATA:  Pain following fall EXAM: LEFT KNEE - 4 VIEW COMPARISON:  None. FINDINGS: Frontal, lateral, and bilateral oblique views were obtained. There is no demonstrable fracture or dislocation. No joint effusion. There is mild generalized joint space narrowing. There is extensive chondrocalcinosis. IMPRESSION: Mild generalized joint space narrowing. Extensive chondrocalcinosis. Chondrocalcinosis may be seen with osteoarthritis but also may be seen with calcium pyrophosphate deposition disease. No fracture or joint effusion. Electronically Signed   By: Bretta Bang III M.D.   On: 11/14/2015 10:26   Dg Ankle Complete Left  11/14/2015  CLINICAL DATA:  81 year old who fell while standing up in the bathroom earlier today. Left ankle pain. Initial encounter. EXAM: LEFT ANKLE COMPLETE - 3+ VIEW COMPARISON:  Left foot x-rays 03/14/2015. No prior left ankle x-rays. FINDINGS: Severe chronic diffuse soft tissue swelling, especially medially, as noted previously. No  evidence of acute fracture or dislocation. Ankle mortise intact with mild joint space narrowing. Subchondral cyst involving the medial aspect of the tailor dome. Osseous demineralization. No  visible joint effusion. IMPRESSION: 1. No acute osseous abnormality. 2. Mild osteoarthritis with a subchondral cyst involving the medial talar dome. 3. Severe chronic diffuse soft tissue swelling, especially medially. Electronically Signed   By: Hulan Saashomas  Lawrence M.D.   On: 11/14/2015 10:29   Ct Head Wo Contrast  11/14/2015  CLINICAL DATA:  Fall yesterday, hit head, on Plavix EXAM: CT HEAD WITHOUT CONTRAST CT CERVICAL SPINE WITHOUT CONTRAST TECHNIQUE: Multidetector CT imaging of the head and cervical spine was performed following the standard protocol without intravenous contrast. Multiplanar CT image reconstructions of the cervical spine were also generated. COMPARISON:  09/12/2015 FINDINGS: CT HEAD FINDINGS No skull fracture is noted. Paranasal sinuses and mastoid air cells are unremarkable. No intracranial hemorrhage, mass effect or midline shift. There is stable atrophy. Stable chronic white matter disease. There is new area of decreased attenuation in right posterior parietal lobe measures at least 4.5 cm. This is highly suspicious for acute or subacute infarct. Clinical correlation is necessary. Further correlation with brain MRI with diffusion imaging is recommended. Stable lacunar infarct right anterior periventricular. CT CERVICAL SPINE FINDINGS No skull fracture is noted. Paranasal sinuses and mastoid air cells are unremarkable. Degenerative changes are noted C1-C2 articulation. There is pannus formation surrounding the odontoid. Computer processed images shows no acute fracture or subluxation. Disc space flattening with mild anterior spurring at C3-C4 level. Significant disc space flattening with anterior and posterior spurring at C4-C5-C5-C6 and C6-C7 level. No prevertebral soft tissue swelling. Cervical airway is  patent. There is no pneumothorax in visualized lung apices. IMPRESSION: 1. There is new area of decreased attenuation in right posterior parietal lobe axial image 16 measures at least 4.5 cm. Evolving or subacute infarct cannot be excluded. Clinical correlation is necessary. Further correlation with brain MRI is recommended. 2. Stable atrophy and chronic white matter disease. 3. No cervical spine acute fracture or subluxation. Multilevel degenerative changes as described above. These results were called by telephone at the time of interpretation on 11/14/2015 at 11:48 am to Dr. Crista CurbANA LIU , who verbally acknowledged these results. Electronically Signed   By: Natasha MeadLiviu  Pop M.D.   On: 11/14/2015 11:48   Ct Cervical Spine Wo Contrast  11/14/2015  CLINICAL DATA:  Fall yesterday, hit head, on Plavix EXAM: CT HEAD WITHOUT CONTRAST CT CERVICAL SPINE WITHOUT CONTRAST TECHNIQUE: Multidetector CT imaging of the head and cervical spine was performed following the standard protocol without intravenous contrast. Multiplanar CT image reconstructions of the cervical spine were also generated. COMPARISON:  09/12/2015 FINDINGS: CT HEAD FINDINGS No skull fracture is noted. Paranasal sinuses and mastoid air cells are unremarkable. No intracranial hemorrhage, mass effect or midline shift. There is stable atrophy. Stable chronic white matter disease. There is new area of decreased attenuation in right posterior parietal lobe measures at least 4.5 cm. This is highly suspicious for acute or subacute infarct. Clinical correlation is necessary. Further correlation with brain MRI with diffusion imaging is recommended. Stable lacunar infarct right anterior periventricular. CT CERVICAL SPINE FINDINGS No skull fracture is noted. Paranasal sinuses and mastoid air cells are unremarkable. Degenerative changes are noted C1-C2 articulation. There is pannus formation surrounding the odontoid. Computer processed images shows no acute fracture or  subluxation. Disc space flattening with mild anterior spurring at C3-C4 level. Significant disc space flattening with anterior and posterior spurring at C4-C5-C5-C6 and C6-C7 level. No prevertebral soft tissue swelling. Cervical airway is patent. There is no pneumothorax in visualized lung apices. IMPRESSION: 1. There is new area of decreased attenuation  in right posterior parietal lobe axial image 16 measures at least 4.5 cm. Evolving or subacute infarct cannot be excluded. Clinical correlation is necessary. Further correlation with brain MRI is recommended. 2. Stable atrophy and chronic white matter disease. 3. No cervical spine acute fracture or subluxation. Multilevel degenerative changes as described above. These results were called by telephone at the time of interpretation on 11/14/2015 at 11:48 am to Dr. Crista Curb , who verbally acknowledged these results. Electronically Signed   By: Natasha Mead M.D.   On: 11/14/2015 11:48   Mr Brain Wo Contrast  11/14/2015  CLINICAL DATA:  81 year old female who fell off toilet at 1700 hours yesterday. Confusion. Pain. Initial encounter. The examination had to be discontinued prior to completion due to patient condition. EXAM: MRI HEAD WITHOUT CONTRAST TECHNIQUE: Multiplanar, multiecho pulse sequences of the brain and surrounding structures were obtained without intravenous contrast. COMPARISON:  head and cervical spine CT 1120 hours today and earlier. FINDINGS: Confluent area of restricted diffusion in compass Ing 4-5 cm of the lateral right occipital lobe and posterior most right temporal lobe. This corresponds to the cortical hypodensity on the earlier CT. No other right hemisphere restricted diffusion. There is associated petechial hemorrhage (series 8, image 13). Cytotoxic edema as seen on the earlier CT, no significant mass effect. No contralateral left hemisphere, or posterior fossa restricted diffusion. Major intracranial vascular flow voids are within normal  limits. Chronic lacunar type infarcts in the right MCA white matter, right basal ganglia, right thalamus. Additional Patchy and confluent bilateral cerebral white matter nonspecific T2 and FLAIR hyperintensity. Brainstem and cerebellum are normal for age. Chronic hemosiderin associated with the right basal ganglia lacune. No midline shift, mass effect, evidence of mass lesion, ventriculomegaly, or extra-axial collection. Negative visualized cervical spine. Cervicomedullary junction and pituitary are within normal limits. Normal bone marrow signal. Postoperative changes to the globes. Visualized paranasal sinuses and mastoids are clear. IMPRESSION: 1. Acute 4-5 cm infarct in the posterior right hemisphere, right MCA/PCA watershed area. Trace petechial hemorrhage. No mass effect. 2. Chronic superimposed chronic small vessel ischemia in the right MCA and PCA territories. 3.  The examination had to be discontinued prior to completion. Electronically Signed   By: Odessa Fleming M.D.   On: 11/14/2015 14:49   Dg Shoulder Left  11/14/2015  CLINICAL DATA:  81 year old who fell while standing up in the bathroom earlier today. Left shoulder pain. Initial encounter. EXAM: LEFT SHOULDER - 2+ VIEW COMPARISON:  None. FINDINGS: No evidence of acute fracture or glenohumeral dislocation. Subacromial space well preserved. Acromioclavicular joint intact with severe degenerative changes. Osseous demineralization. Faint calcification at the insertion of the supraspinatus tendon on the greater tuberosity of the humeral head. Small spur arising from the inferior glenoid. IMPRESSION: 1. No acute osseous abnormality. 2. Severe degenerative changes involving the acromioclavicular joint. 3. Chronic calcific supraspinatus tendinitis. 4. Mild osteoarthritis involving the glenohumeral joint. Electronically Signed   By: Hulan Saas M.D.   On: 11/14/2015 10:31    EKG: Independently reviewed. Sinus rhythm .  Assessment/Plan Active  Problems:   CVA (cerebral infarction)   Acute CVA (cerebrovascular accident) (HCC)   Acute 4 to 5 cm infarct int he posterior right hemisphere right MCA/PCA watershed area: Admit to telemetry. Continue with aspirin 325 mg daily.  Stroke work up ordered.  MRA of the head and neck, echocardiogram, carotid duplex ordered.  hgba1c, lipid panel ordered.  Neurology consulted and recommendations given.  Permissive hypertension.  PT/OT evaluation She passed swallow eval  and carb modified diet ordered.   Diabetes mellitus: hgba1c ordered.  SSI.       Code Status: full code.  DVT Prophylaxis: Family Communication: none at bedside.  Disposition Plan: pending further investigation.   Time spent: 55 min  [facility] Pager [phone]

## 2015-11-14 NOTE — ED Provider Notes (Signed)
CSN: [id number]     Arrival date & time 11/14/15  0806 History   First MD Initiated Contact with Patient 11/14/15 [phone]     Chief Complaint  Patient presents with  . Fall     (Consider location/radiation/quality/duration/timing/severity/associated sxs/prior Treatment) HPI 81 year old female who presents after fall. History of CAD status post stenting on Plavix, prior tobacco use, hypertension, hyperlipidemia and type 2 diabetes. Prior TIA in the past. States that she had a mechanical fall off of her toilet yesterday evening, when she lost hold of the railing while trying to pull herself up. She fell into the bathtub on her left side and did hit her head. Did not have any loss of consciousness. States that she initially felt well, but today felt headache, and increasing pain in her left ankle, knee, and shoulder. She was sent to the emergency department for evaluation. Denies any vision changes, speech changes, numbness or weakness, and states that at baseline she is wheelchair bound.  Past Medical History  Diagnosis Date  . CAD (coronary artery disease)     a. 2004 Cath/PCI with stenting x 1 in [place].;  Lexiscan Myoview 6/14:normal study, no ischemia, EF 79%  . Hypertension   . Hyperlipemia   . History of tobacco abuse     a. 50 pack yr hx, quit 2004  . History of shingles     a. left leg with residual pain.  . Peripheral neuropathy (HCC)   . Hard of hearing   . Fibrous breast lumps     "both sides; I did not have cancer" (05/13/2013)  . Family history of anesthesia complication     "took a long time to wake my mother up after one of her surgeries" (05/13/2013)  . Macular degeneration, wet (HCC)     "both eyes" (05/13/2013)  . Type II diabetes mellitus (HCC)   . Arthritis     "hands; maybe feet" (05/13/2013)  . TIA (transient ischemic attack)   . Peripheral neuropathy (HCC) 05/23/2014  . Renal disorder     Chronic kidney disease  . Osteoporosis   . Glaucoma    Past Surgical  History  Procedure Laterality Date  . Tonsillectomy    . Appendectomy    . Carpal tunnel release Bilateral   . Mastectomy Bilateral 1980's    "multiple fibrous cysts; never had cancer" (05/13/2013)20  . Cesarean section  1960; 1962  . Inner ear surgery Left 1962    "put a piece of steel and a tube & a vein from my ?left hand in" (05/13/2013)  . Cataract extraction w/ intraocular lens  implant, bilateral Bilateral 2000  . Breast biopsy    . Tubal ligation  1963  . Coronary angioplasty with stent placement  2004    "1" (05/13/2013)   Family History  Problem Relation Age of Onset  . Stroke Father     deceased  . Other Mother     deceased - possibly renal failure   Social History  Substance Use Topics  . Smoking status: Former Smoker -- 1.00 packs/day for 50 years    Types: Cigarettes    Quit date: 05/12/2003  . Smokeless tobacco: Never Used  . Alcohol Use: Yes     Comment: 05/13/2013 "socially; very seldom anymore"   OB History    No data available     Review of Systems 10/14 systems reviewed and are negative other than those stated in the HPI    Allergies  Review of patient's allergies indicates  no known allergies.  Home Medications   Prior to Admission medications   Medication Sig Start Date End Date Taking? Authorizing Provider  acetaminophen (TYLENOL) 500 MG tablet Take 1,000 mg by mouth 2 (two) times daily.    Yes Historical Provider, MD  aspirin 325 MG buffered tablet Take 325 mg by mouth daily.   Yes Historical Provider, MD  bimatoprost (LUMIGAN) 0.01 % SOLN Place 1 drop into both eyes daily.    Yes Historical Provider, MD  Calcium Carb-Cholecalciferol (OYSTER SHELL CALCIUM + D) 500-400 MG-UNIT TABS Take 1 tablet by mouth 3 (three) times daily.    Yes Historical Provider, MD  capsicum oleoresin (TRIXAICIN) 0.025 % cream Apply 1 application topically every 12 (twelve) hours as needed (for pain). Apply to feet   Yes Historical Provider, MD  cloNIDine (CATAPRES) 0.1  MG tablet Take 0.1 mg by mouth 2 (two) times daily.   Yes Historical Provider, MD  docusate sodium (COLACE) 100 MG capsule Take 200 mg by mouth at bedtime.    Yes Historical Provider, MD  donepezil (ARICEPT) 5 MG tablet Take 5 mg by mouth at bedtime.   Yes Historical Provider, MD  ibandronate (BONIVA) 150 MG tablet Take 150 mg by mouth every 30 (thirty) days. Take in the morning with a full glass of water, on an empty stomach, and do not take anything else by mouth or lie down for the next 30 min.   Yes Historical Provider, MD  isosorbide mononitrate (IMDUR) 30 MG 24 hr tablet Take 1 tablet (30 mg total) by mouth daily. 05/11/13  Yes Zannie Cove, MD  lisinopril (PRINIVIL,ZESTRIL) 40 MG tablet Take 40 mg by mouth daily.   Yes Historical Provider, MD  metFORMIN (GLUCOPHAGE) 1000 MG tablet Take 1,000 mg by mouth daily with breakfast.   Yes Historical Provider, MD  Multiple Vitamins-Minerals (I-VITE PO) Take 1 tablet by mouth daily.   Yes Historical Provider, MD  nebivolol (BYSTOLIC) 5 MG tablet Take 5 mg by mouth daily.   Yes Historical Provider, MD  Polyethyl Glycol-Propyl Glycol (SYSTANE ULTRA OP) Place 1 drop into both eyes 3 (three) times daily.    Yes Historical Provider, MD  polyvinyl alcohol (LIQUIFILM TEARS) 1.4 % ophthalmic solution Place 2 drops into both eyes 4 (four) times daily.    Yes Historical Provider, MD  pregabalin (LYRICA) 150 MG capsule Take 150 mg by mouth 2 (two) times daily.    Yes Historical Provider, MD  psyllium (REGULOID) 0.52 G capsule Take 0.52 g by mouth at bedtime.    Yes Historical Provider, MD  sertraline (ZOLOFT) 50 MG tablet Take 50 mg by mouth daily.   Yes Historical Provider, MD  simvastatin (ZOCOR) 10 MG tablet Take 15 mg by mouth at bedtime.    Yes Historical Provider, MD  Zinc Oxide (TRIPLE PASTE) 12.8 % ointment Apply 1 application topically as needed for irritation. Patient taking differently: Apply 1 application topically as needed for irritation. Apply to  buttocks 11/12/14  Yes Marily Memos, MD  clopidogrel (PLAVIX) 75 MG tablet Take 1 tablet (75 mg total) by mouth daily with breakfast. Patient not taking: Reported on 11/14/2015 05/14/13   Rhetta Mura, MD   BP 200/94 mmHg  Pulse 63  Temp(Src) 98.5 F (36.9 C) (Oral)  Resp 16  SpO2 94% Physical Exam Physical Exam  Nursing note and vitals reviewed. Constitutional: Well developed, well nourished, non-toxic, and in no acute distress Head: Normocephalic and atraumatic.  Mouth/Throat: Oropharynx is clear and moist.  Neck: Normal  range of motion. Neck supple.  Cardiovascular: Normal rate and regular rhythm.   Pulmonary/Chest: Effort normal and breath sounds normal. No chest wall tenderness. Abdominal: Soft. There is no tenderness. There is no rebound and no guarding.  Musculoskeletal: No deformities. Tender in left ankle (lateral malleolus), anterior knee, and right shoulder especially with range of motion. No CTLS spine tenderness. Neurological: Alert, no facial droop, fluent speech, moves all extremities symmetrically (able to hold extremities antigravity in all 4 extremities, LLE limited due to pain in her knee and ankle), sensation to light touch in tact over face and all four extremities, EOMI, PERRL, palate elevates symmetrically Skin: Skin is warm and dry.  Psychiatric: Cooperative  ED Course  Procedures (including critical care time) Labs Review Labs Reviewed  BASIC METABOLIC PANEL - Abnormal; Notable for the following:    Glucose, Bld 114 (*)    BUN 23 (*)    Creatinine, Ser 1.07 (*)    GFR calc non Af Amer 47 (*)    GFR calc Af Amer 54 (*)    All other components within normal limits  CBG MONITORING, ED - Abnormal; Notable for the following:    Glucose-Capillary 102 (*)    All other components within normal limits  CBC    Imaging Review Dg Pelvis 1-2 Views  11/14/2015  CLINICAL DATA:  Status post fall while standing up in the bathroom. Patient reports back and  tail bone pain. EXAM: PELVIS - 1-2 VIEW COMPARISON:  AP pelvis of September 12, 2015 FINDINGS: The bones are osteopenic. The sacrum and SI joints are grossly intact. The pubic rami are intact. There is mild narrowing of the hip joint spaces bilaterally. The pubic rami are intact. The proximal femurs are intact where visualized. There are numerous pelvic phleboliths and there are arterial calcifications. IMPRESSION: There is no acute bony abnormality of the pelvis. There is mild degenerative change of both hips. Electronically Signed   By: David  [place] M.D.   On: 11/14/2015 10:28   Dg Sacrum/coccyx  11/14/2015  CLINICAL DATA:  Pain following fall EXAM: SACRUM AND COCCYX - 2+ VIEW COMPARISON:  None. FINDINGS: Frontal and lateral views were obtained. No fracture or diastases. There is osteoarthritic change in both sacroiliac joints. Bones are somewhat osteoporotic. There is mild spondylolisthesis at L4-5, likely due to underlying spondylosis. There are scattered foci of arterial vascular calcification. IMPRESSION: No fracture or diastases. Bones osteoporotic. Mild spondylolisthesis at L4-5, likely due to underlying spondylosis. Electronically Signed   By: Bretta Bang III M.D.   On: 11/14/2015 10:29   Dg Knee 2 Views Left  11/14/2015  CLINICAL DATA:  Pain following fall EXAM: LEFT KNEE - 4 VIEW COMPARISON:  None. FINDINGS: Frontal, lateral, and bilateral oblique views were obtained. There is no demonstrable fracture or dislocation. No joint effusion. There is mild generalized joint space narrowing. There is extensive chondrocalcinosis. IMPRESSION: Mild generalized joint space narrowing. Extensive chondrocalcinosis. Chondrocalcinosis may be seen with osteoarthritis but also may be seen with calcium pyrophosphate deposition disease. No fracture or joint effusion. Electronically Signed   By: Bretta Bang III M.D.   On: 11/14/2015 10:26   Dg Ankle Complete Left  11/14/2015  CLINICAL DATA:   81 year old who fell while standing up in the bathroom earlier today. Left ankle pain. Initial encounter. EXAM: LEFT ANKLE COMPLETE - 3+ VIEW COMPARISON:  Left foot x-rays 03/14/2015. No prior left ankle x-rays. FINDINGS: Severe chronic diffuse soft tissue swelling, especially medially, as noted previously. No evidence of  acute fracture or dislocation. Ankle mortise intact with mild joint space narrowing. Subchondral cyst involving the medial aspect of the tailor dome. Osseous demineralization. No visible joint effusion. IMPRESSION: 1. No acute osseous abnormality. 2. Mild osteoarthritis with a subchondral cyst involving the medial talar dome. 3. Severe chronic diffuse soft tissue swelling, especially medially. Electronically Signed   By: Hulan Saashomas  Lawrence M.D.   On: 11/14/2015 10:29   Ct Head Wo Contrast  11/14/2015  CLINICAL DATA:  Fall yesterday, hit head, on Plavix EXAM: CT HEAD WITHOUT CONTRAST CT CERVICAL SPINE WITHOUT CONTRAST TECHNIQUE: Multidetector CT imaging of the head and cervical spine was performed following the standard protocol without intravenous contrast. Multiplanar CT image reconstructions of the cervical spine were also generated. COMPARISON:  09/12/2015 FINDINGS: CT HEAD FINDINGS No skull fracture is noted. Paranasal sinuses and mastoid air cells are unremarkable. No intracranial hemorrhage, mass effect or midline shift. There is stable atrophy. Stable chronic white matter disease. There is new area of decreased attenuation in right posterior parietal lobe measures at least 4.5 cm. This is highly suspicious for acute or subacute infarct. Clinical correlation is necessary. Further correlation with brain MRI with diffusion imaging is recommended. Stable lacunar infarct right anterior periventricular. CT CERVICAL SPINE FINDINGS No skull fracture is noted. Paranasal sinuses and mastoid air cells are unremarkable. Degenerative changes are noted C1-C2 articulation. There is pannus formation  surrounding the odontoid. Computer processed images shows no acute fracture or subluxation. Disc space flattening with mild anterior spurring at C3-C4 level. Significant disc space flattening with anterior and posterior spurring at C4-C5-C5-C6 and C6-C7 level. No prevertebral soft tissue swelling. Cervical airway is patent. There is no pneumothorax in visualized lung apices. IMPRESSION: 1. There is new area of decreased attenuation in right posterior parietal lobe axial image 16 measures at least 4.5 cm. Evolving or subacute infarct cannot be excluded. Clinical correlation is necessary. Further correlation with brain MRI is recommended. 2. Stable atrophy and chronic white matter disease. 3. No cervical spine acute fracture or subluxation. Multilevel degenerative changes as described above. These results were called by telephone at the time of interpretation on 11/14/2015 at 11:48 am to Dr. Crista CurbANA Leialoha Hanna , who verbally acknowledged these results. Electronically Signed   By: Natasha MeadLiviu  Pop M.D.   On: 11/14/2015 11:48   Ct Cervical Spine Wo Contrast  11/14/2015  CLINICAL DATA:  Fall yesterday, hit head, on Plavix EXAM: CT HEAD WITHOUT CONTRAST CT CERVICAL SPINE WITHOUT CONTRAST TECHNIQUE: Multidetector CT imaging of the head and cervical spine was performed following the standard protocol without intravenous contrast. Multiplanar CT image reconstructions of the cervical spine were also generated. COMPARISON:  09/12/2015 FINDINGS: CT HEAD FINDINGS No skull fracture is noted. Paranasal sinuses and mastoid air cells are unremarkable. No intracranial hemorrhage, mass effect or midline shift. There is stable atrophy. Stable chronic white matter disease. There is new area of decreased attenuation in right posterior parietal lobe measures at least 4.5 cm. This is highly suspicious for acute or subacute infarct. Clinical correlation is necessary. Further correlation with brain MRI with diffusion imaging is recommended. Stable  lacunar infarct right anterior periventricular. CT CERVICAL SPINE FINDINGS No skull fracture is noted. Paranasal sinuses and mastoid air cells are unremarkable. Degenerative changes are noted C1-C2 articulation. There is pannus formation surrounding the odontoid. Computer processed images shows no acute fracture or subluxation. Disc space flattening with mild anterior spurring at C3-C4 level. Significant disc space flattening with anterior and posterior spurring at C4-C5-C5-C6 and C6-C7 level. No  prevertebral soft tissue swelling. Cervical airway is patent. There is no pneumothorax in visualized lung apices. IMPRESSION: 1. There is new area of decreased attenuation in right posterior parietal lobe axial image 16 measures at least 4.5 cm. Evolving or subacute infarct cannot be excluded. Clinical correlation is necessary. Further correlation with brain MRI is recommended. 2. Stable atrophy and chronic white matter disease. 3. No cervical spine acute fracture or subluxation. Multilevel degenerative changes as described above. These results were called by telephone at the time of interpretation on 11/14/2015 at 11:48 am to Dr. Crista Curb , who verbally acknowledged these results. Electronically Signed   By: Natasha Mead M.D.   On: 11/14/2015 11:48   Mr Brain Wo Contrast  11/14/2015  CLINICAL DATA:  81 year old female who fell off toilet at 1700 hours yesterday. Confusion. Pain. Initial encounter. The examination had to be discontinued prior to completion due to patient condition. EXAM: MRI HEAD WITHOUT CONTRAST TECHNIQUE: Multiplanar, multiecho pulse sequences of the brain and surrounding structures were obtained without intravenous contrast. COMPARISON:  head and cervical spine CT 1120 hours today and earlier. FINDINGS: Confluent area of restricted diffusion in compass Ing 4-5 cm of the lateral right occipital lobe and posterior most right temporal lobe. This corresponds to the cortical hypodensity on the earlier CT.  No other right hemisphere restricted diffusion. There is associated petechial hemorrhage (series 8, image 13). Cytotoxic edema as seen on the earlier CT, no significant mass effect. No contralateral left hemisphere, or posterior fossa restricted diffusion. Major intracranial vascular flow voids are within normal limits. Chronic lacunar type infarcts in the right MCA white matter, right basal ganglia, right thalamus. Additional Patchy and confluent bilateral cerebral white matter nonspecific T2 and FLAIR hyperintensity. Brainstem and cerebellum are normal for age. Chronic hemosiderin associated with the right basal ganglia lacune. No midline shift, mass effect, evidence of mass lesion, ventriculomegaly, or extra-axial collection. Negative visualized cervical spine. Cervicomedullary junction and pituitary are within normal limits. Normal bone marrow signal. Postoperative changes to the globes. Visualized paranasal sinuses and mastoids are clear. IMPRESSION: 1. Acute 4-5 cm infarct in the posterior right hemisphere, right MCA/PCA watershed area. Trace petechial hemorrhage. No mass effect. 2. Chronic superimposed chronic small vessel ischemia in the right MCA and PCA territories. 3.  The examination had to be discontinued prior to completion. Electronically Signed   By: Odessa Fleming M.D.   On: 11/14/2015 14:49   Dg Shoulder Left  11/14/2015  CLINICAL DATA:  81 year old who fell while standing up in the bathroom earlier today. Left shoulder pain. Initial encounter. EXAM: LEFT SHOULDER - 2+ VIEW COMPARISON:  None. FINDINGS: No evidence of acute fracture or glenohumeral dislocation. Subacromial space well preserved. Acromioclavicular joint intact with severe degenerative changes. Osseous demineralization. Faint calcification at the insertion of the supraspinatus tendon on the greater tuberosity of the humeral head. Small spur arising from the inferior glenoid. IMPRESSION: 1. No acute osseous abnormality. 2. Severe  degenerative changes involving the acromioclavicular joint. 3. Chronic calcific supraspinatus tendinitis. 4. Mild osteoarthritis involving the glenohumeral joint. Electronically Signed   By: Hulan Saas M.D.   On: 11/14/2015 10:31   I have personally reviewed and evaluated these images and lab results as part of my medical decision-making.   EKG Interpretation   Date/Time:  Tuesday November 14 2015 08:23:29 EST Ventricular Rate:  60 PR Interval:  167 QRS Duration: 94 QT Interval:  429 QTC Calculation: 429 R Axis:   28 Text Interpretation:  Sinus rhythm RSR' in  V1 or V2, right VCD or RVH No  significant change since last tracing Confirmed by Trenden Hazelrigg MD, Deni Berti [phone]) on  11/14/2015 11:48:39 AM      MDM   Final diagnoses:  Fall  Acute CVA (cerebrovascular accident) (HCC)   In short, this is an 81 year old female who presents with left-sided pain and headache after fall. She is alert and fully oriented. Grossly neurologically intact, although exam is somewhat limited by pain on her left side. Imaging studies are visualized. Blood work reviewed, and are unremarkable. CT cervical spine negative for acute injury. CT [place] concerning for subacute versus acute stroke in the parietal region of her right brain. X-rays of her left shoulder, knee, and ankle are negative for acute traumatic injuries. Discussed with neurology hospitalist regarding concern for stroke on CT. Recommended on MRI. Visualized and reviewed with neurology, and c/f new stroke. Plan to admit to hospitalist service with transfer to [facility].    Lavera Guise, MD 11/14/15 [phone]

## 2015-11-14 NOTE — Consult Note (Signed)
Stroke Consult Consulting Physician: Dr Verdie Mosher ED  Chief Complaint: fall, abnormal head CT  HPI: Judy Perez is an 81 y.o. female hx of HTN, CAD, HLD presenting to ED after suffering a fall and hitting her head. At baseline requires assistance walking, lives in assisted living. Yesterday fell while getting up from the toilet, hit head against the wall. Currently denies any acute symptoms, denies focal weakness, sensory or speech deficits.   CT head imaging reviewed, shows possible acute infarct. MRI brain imaging reviewed, shows acute infarct in the right occipital/temporal lobe.   Date last known well: unclear Time last known well: unclear tPA Given: no, unclear LSW Modified Rankin: Rankin Score=2  Past Medical History  Diagnosis Date  . CAD (coronary artery disease)     a. 2004 Cath/PCI with stenting x 1 in [place].;  Lexiscan Myoview 6/14:normal study, no ischemia, EF 79%  . Hypertension   . Hyperlipemia   . History of tobacco abuse     a. 50 pack yr hx, quit 2004  . History of shingles     a. left leg with residual pain.  . Peripheral neuropathy (HCC)   . Hard of hearing   . Fibrous breast lumps     "both sides; I did not have cancer" (05/13/2013)  . Family history of anesthesia complication     "took a long time to wake my mother up after one of her surgeries" (05/13/2013)  . Macular degeneration, wet (HCC)     "both eyes" (05/13/2013)  . Type II diabetes mellitus (HCC)   . Arthritis     "hands; maybe feet" (05/13/2013)  . TIA (transient ischemic attack)   . Peripheral neuropathy (HCC) 05/23/2014  . Renal disorder     Chronic kidney disease  . Osteoporosis   . Glaucoma     Past Surgical History  Procedure Laterality Date  . Tonsillectomy    . Appendectomy    . Carpal tunnel release Bilateral   . Mastectomy Bilateral 1980's    "multiple fibrous cysts; never had cancer" (05/13/2013)20  . Cesarean section  1960; 1962  . Inner ear surgery Left 1962    "put a piece of  steel and a tube & a vein from my ?left hand in" (05/13/2013)  . Cataract extraction w/ intraocular lens  implant, bilateral Bilateral 2000  . Breast biopsy    . Tubal ligation  1963  . Coronary angioplasty with stent placement  2004    "1" (05/13/2013)    Family History  Problem Relation Age of Onset  . Stroke Father     deceased  . Other Mother     deceased - possibly renal failure   Social History:  reports that she quit smoking about 12 years ago. Her smoking use included Cigarettes. She has a 50 pack-year smoking history. She has never used smokeless tobacco. She reports that she drinks alcohol. She reports that she does not use illicit drugs.  Allergies: No Known Allergies   (Not in a hospital admission)  ROS: Out of a complete 14 system review, the patient complains of only the following symptoms, and all other reviewed systems are negative. +gait instability  Physical Examination: Filed Vitals:   11/14/15 1300 11/14/15 1457  BP: 186/132 200/94  Pulse: 54 63  Temp:    Resp:  16   Physical Exam  Constitutional: He appears well-developed and well-nourished.  Psych: Affect appropriate to situation Eyes: No scleral injection HENT: No OP obstrucion Head: Normocephalic.  Cardiovascular: Normal  rate and regular rhythm.  Respiratory: Effort normal and breath sounds normal.  GI: Soft. Bowel sounds are normal. No distension. There is no tenderness.  Skin: WDI   Neurologic Examination: Mental Status: Alert, oriented to name, hospital, November. Speech fluent without evidence of aphasia. Difficulty following multi-step commands though may be hearing related. Appears to neglect her left side Cranial Nerves: II: optic discs not visualized, left VF cut, pupils equal, round, reactive to light  III,IV, VI: ptosis not present, extra-ocular motions intact bilaterally V,VII: smile symmetric, facial light touch sensation normal bilaterally VIII: hearing normal  bilaterally IX,X: gag reflex present XI: trapezius strength/neck flexion strength normal bilaterally XII: tongue strength normal  Motor: Proximal LUE weakness though able to lift against gravity and light resistance. Proximal bilateral LE weakness, distal strength appears intact Tone and bulk:normal tone throughout; no atrophy noted Sensory: diminished sensation on left side Deep Tendon Reflexes: 1+ and symmetric throughout Plantars: Right: downgoing   Left: downgoing Cerebellar: Difficulty following commands, unable to formally test Gait: deferred  Laboratory Studies:   Basic Metabolic Panel:  Recent Labs Lab 11/14/15 0828  NA 144  K 4.3  CL 106  CO2 31  GLUCOSE 114*  BUN 23*  CREATININE 1.07*  CALCIUM 10.0    Liver Function Tests: No results for input(s): AST, ALT, ALKPHOS, BILITOT, PROT, ALBUMIN in the last 168 hours. No results for input(s): LIPASE, AMYLASE in the last 168 hours. No results for input(s): AMMONIA in the last 168 hours.  CBC:  Recent Labs Lab 11/14/15 0828  WBC 5.8  HGB 14.1  HCT 42.4  MCV 95.1  PLT 230    Cardiac Enzymes: No results for input(s): CKTOTAL, CKMB, CKMBINDEX, TROPONINI in the last 168 hours.  BNP: Invalid input(s): POCBNP  CBG:  Recent Labs Lab 11/14/15 0829  GLUCAP 102*    Microbiology: No results found for this or any previous visit.  Coagulation Studies: No results for input(s): LABPROT, INR in the last 72 hours.  Urinalysis: No results for input(s): COLORURINE, LABSPEC, PHURINE, GLUCOSEU, HGBUR, BILIRUBINUR, KETONESUR, PROTEINUR, UROBILINOGEN, NITRITE, LEUKOCYTESUR in the last 168 hours.  Invalid input(s): APPERANCEUR  Lipid Panel:     Component Value Date/Time   CHOL 186 05/14/2013 0545   TRIG 112 05/14/2013 0545   HDL 58 05/14/2013 0545   CHOLHDL 3.2 05/14/2013 0545   VLDL 22 05/14/2013 0545   LDLCALC 106* 05/14/2013 0545    HgbA1C:  Lab Results  Component Value Date   HGBA1C 6.7* 05/13/2013     Urine Drug Screen:     Component Value Date/Time   LABOPIA NONE DETECTED 02/21/2015 0513   COCAINSCRNUR NONE DETECTED 02/21/2015 0513   LABBENZ NONE DETECTED 02/21/2015 0513   AMPHETMU NONE DETECTED 02/21/2015 0513   THCU NONE DETECTED 02/21/2015 0513   LABBARB NONE DETECTED 02/21/2015 0513    Alcohol Level: No results for input(s): ETH in the last 168 hours.  Other results:  Imaging: Dg Pelvis 1-2 Views  11/14/2015  CLINICAL DATA:  Status post fall while standing up in the bathroom. Patient reports back and tail bone pain. EXAM: PELVIS - 1-2 VIEW COMPARISON:  AP pelvis of September 12, 2015 FINDINGS: The bones are osteopenic. The sacrum and SI joints are grossly intact. The pubic rami are intact. There is mild narrowing of the hip joint spaces bilaterally. The pubic rami are intact. The proximal femurs are intact where visualized. There are numerous pelvic phleboliths and there are arterial calcifications. IMPRESSION: There is no acute bony abnormality  of the pelvis. There is mild degenerative change of both hips. Electronically Signed   By: David  SwazilandJordan M.D.   On: 11/14/2015 10:28   Dg Sacrum/coccyx  11/14/2015  CLINICAL DATA:  Pain following fall EXAM: SACRUM AND COCCYX - 2+ VIEW COMPARISON:  None. FINDINGS: Frontal and lateral views were obtained. No fracture or diastases. There is osteoarthritic change in both sacroiliac joints. Bones are somewhat osteoporotic. There is mild spondylolisthesis at L4-5, likely due to underlying spondylosis. There are scattered foci of arterial vascular calcification. IMPRESSION: No fracture or diastases. Bones osteoporotic. Mild spondylolisthesis at L4-5, likely due to underlying spondylosis. Electronically Signed   By: Bretta BangWilliam  Woodruff III M.D.   On: 11/14/2015 10:29   Dg Knee 2 Views Left  11/14/2015  CLINICAL DATA:  Pain following fall EXAM: LEFT KNEE - 4 VIEW COMPARISON:  None. FINDINGS: Frontal, lateral, and bilateral oblique views were  obtained. There is no demonstrable fracture or dislocation. No joint effusion. There is mild generalized joint space narrowing. There is extensive chondrocalcinosis. IMPRESSION: Mild generalized joint space narrowing. Extensive chondrocalcinosis. Chondrocalcinosis may be seen with osteoarthritis but also may be seen with calcium pyrophosphate deposition disease. No fracture or joint effusion. Electronically Signed   By: Bretta BangWilliam  Woodruff III M.D.   On: 11/14/2015 10:26   Dg Ankle Complete Left  11/14/2015  CLINICAL DATA:  81 year old who fell while standing up in the bathroom earlier today. Left ankle pain. Initial encounter. EXAM: LEFT ANKLE COMPLETE - 3+ VIEW COMPARISON:  Left foot x-rays 03/14/2015. No prior left ankle x-rays. FINDINGS: Severe chronic diffuse soft tissue swelling, especially medially, as noted previously. No evidence of acute fracture or dislocation. Ankle mortise intact with mild joint space narrowing. Subchondral cyst involving the medial aspect of the tailor dome. Osseous demineralization. No visible joint effusion. IMPRESSION: 1. No acute osseous abnormality. 2. Mild osteoarthritis with a subchondral cyst involving the medial talar dome. 3. Severe chronic diffuse soft tissue swelling, especially medially. Electronically Signed   By: Hulan Saashomas  Lawrence M.D.   On: 11/14/2015 10:29   Ct Head Wo Contrast  11/14/2015  CLINICAL DATA:  Fall yesterday, hit head, on Plavix EXAM: CT HEAD WITHOUT CONTRAST CT CERVICAL SPINE WITHOUT CONTRAST TECHNIQUE: Multidetector CT imaging of the head and cervical spine was performed following the standard protocol without intravenous contrast. Multiplanar CT image reconstructions of the cervical spine were also generated. COMPARISON:  09/12/2015 FINDINGS: CT HEAD FINDINGS No skull fracture is noted. Paranasal sinuses and mastoid air cells are unremarkable. No intracranial hemorrhage, mass effect or midline shift. There is stable atrophy. Stable chronic white  matter disease. There is new area of decreased attenuation in right posterior parietal lobe measures at least 4.5 cm. This is highly suspicious for acute or subacute infarct. Clinical correlation is necessary. Further correlation with brain MRI with diffusion imaging is recommended. Stable lacunar infarct right anterior periventricular. CT CERVICAL SPINE FINDINGS No skull fracture is noted. Paranasal sinuses and mastoid air cells are unremarkable. Degenerative changes are noted C1-C2 articulation. There is pannus formation surrounding the odontoid. Computer processed images shows no acute fracture or subluxation. Disc space flattening with mild anterior spurring at C3-C4 level. Significant disc space flattening with anterior and posterior spurring at C4-C5-C5-C6 and C6-C7 level. No prevertebral soft tissue swelling. Cervical airway is patent. There is no pneumothorax in visualized lung apices. IMPRESSION: 1. There is new area of decreased attenuation in right posterior parietal lobe axial image 16 measures at least 4.5 cm. Evolving or subacute infarct cannot  be excluded. Clinical correlation is necessary. Further correlation with brain MRI is recommended. 2. Stable atrophy and chronic white matter disease. 3. No cervical spine acute fracture or subluxation. Multilevel degenerative changes as described above. These results were called by telephone at the time of interpretation on 11/14/2015 at 11:48 am to Dr. Crista Curb , who verbally acknowledged these results. Electronically Signed   By: Natasha Mead M.D.   On: 11/14/2015 11:48   Ct Cervical Spine Wo Contrast  11/14/2015  CLINICAL DATA:  Fall yesterday, hit head, on Plavix EXAM: CT HEAD WITHOUT CONTRAST CT CERVICAL SPINE WITHOUT CONTRAST TECHNIQUE: Multidetector CT imaging of the head and cervical spine was performed following the standard protocol without intravenous contrast. Multiplanar CT image reconstructions of the cervical spine were also generated.  COMPARISON:  09/12/2015 FINDINGS: CT HEAD FINDINGS No skull fracture is noted. Paranasal sinuses and mastoid air cells are unremarkable. No intracranial hemorrhage, mass effect or midline shift. There is stable atrophy. Stable chronic white matter disease. There is new area of decreased attenuation in right posterior parietal lobe measures at least 4.5 cm. This is highly suspicious for acute or subacute infarct. Clinical correlation is necessary. Further correlation with brain MRI with diffusion imaging is recommended. Stable lacunar infarct right anterior periventricular. CT CERVICAL SPINE FINDINGS No skull fracture is noted. Paranasal sinuses and mastoid air cells are unremarkable. Degenerative changes are noted C1-C2 articulation. There is pannus formation surrounding the odontoid. Computer processed images shows no acute fracture or subluxation. Disc space flattening with mild anterior spurring at C3-C4 level. Significant disc space flattening with anterior and posterior spurring at C4-C5-C5-C6 and C6-C7 level. No prevertebral soft tissue swelling. Cervical airway is patent. There is no pneumothorax in visualized lung apices. IMPRESSION: 1. There is new area of decreased attenuation in right posterior parietal lobe axial image 16 measures at least 4.5 cm. Evolving or subacute infarct cannot be excluded. Clinical correlation is necessary. Further correlation with brain MRI is recommended. 2. Stable atrophy and chronic white matter disease. 3. No cervical spine acute fracture or subluxation. Multilevel degenerative changes as described above. These results were called by telephone at the time of interpretation on 11/14/2015 at 11:48 am to Dr. Crista Curb , who verbally acknowledged these results. Electronically Signed   By: Natasha Mead M.D.   On: 11/14/2015 11:48   Mr Brain Wo Contrast  11/14/2015  CLINICAL DATA:  81 year old female who fell off toilet at 1700 hours yesterday. Confusion. Pain. Initial encounter.  The examination had to be discontinued prior to completion due to patient condition. EXAM: MRI HEAD WITHOUT CONTRAST TECHNIQUE: Multiplanar, multiecho pulse sequences of the brain and surrounding structures were obtained without intravenous contrast. COMPARISON:  head and cervical spine CT 1120 hours today and earlier. FINDINGS: Confluent area of restricted diffusion in compass Ing 4-5 cm of the lateral right occipital lobe and posterior most right temporal lobe. This corresponds to the cortical hypodensity on the earlier CT. No other right hemisphere restricted diffusion. There is associated petechial hemorrhage (series 8, image 13). Cytotoxic edema as seen on the earlier CT, no significant mass effect. No contralateral left hemisphere, or posterior fossa restricted diffusion. Major intracranial vascular flow voids are within normal limits. Chronic lacunar type infarcts in the right MCA white matter, right basal ganglia, right thalamus. Additional Patchy and confluent bilateral cerebral white matter nonspecific T2 and FLAIR hyperintensity. Brainstem and cerebellum are normal for age. Chronic hemosiderin associated with the right basal ganglia lacune. No midline shift, mass effect,  evidence of mass lesion, ventriculomegaly, or extra-axial collection. Negative visualized cervical spine. Cervicomedullary junction and pituitary are within normal limits. Normal bone marrow signal. Postoperative changes to the globes. Visualized paranasal sinuses and mastoids are clear. IMPRESSION: 1. Acute 4-5 cm infarct in the posterior right hemisphere, right MCA/PCA watershed area. Trace petechial hemorrhage. No mass effect. 2. Chronic superimposed chronic small vessel ischemia in the right MCA and PCA territories. 3.  The examination had to be discontinued prior to completion. Electronically Signed   By: Odessa Fleming M.D.   On: 11/14/2015 14:49   Dg Shoulder Left  11/14/2015  CLINICAL DATA:  81 year old who fell while standing up in  the bathroom earlier today. Left shoulder pain. Initial encounter. EXAM: LEFT SHOULDER - 2+ VIEW COMPARISON:  None. FINDINGS: No evidence of acute fracture or glenohumeral dislocation. Subacromial space well preserved. Acromioclavicular joint intact with severe degenerative changes. Osseous demineralization. Faint calcification at the insertion of the supraspinatus tendon on the greater tuberosity of the humeral head. Small spur arising from the inferior glenoid. IMPRESSION: 1. No acute osseous abnormality. 2. Severe degenerative changes involving the acromioclavicular joint. 3. Chronic calcific supraspinatus tendinitis. 4. Mild osteoarthritis involving the glenohumeral joint. Electronically Signed   By: Hulan Saas M.D.   On: 11/14/2015 10:31    Assessment: 81 y.o. female presenting with a fall found to have acute infarct on imaging. Notes prior TIA history with multiple risk factors including CAD, HTN, HLD. Admitted for further stroke workup.    Plan: 1. HgbA1c, fasting lipid panel 2. MRA  of the brain without contrast 3. PT consult, OT consult, Speech consult 4. Echocardiogram 5. Carotid dopplers 6. Prophylactic therapy-change to Plavix  daily 7. Risk factor modification 8. Telemetry monitoring 9. Frequent neuro checks 10. NPO until RN stroke swallow screen    Elspeth Cho, DO [facility] [phone]  If 7pm- 7am, please page neurology on call as listed in AMION. 11/14/2015, 3:09 PM

## 2015-11-14 NOTE — ED Notes (Signed)
Verdie MosherLiu, MD, aware of patient's blood pressure. No new orders at this time.

## 2015-11-14 NOTE — ED Notes (Signed)
Bed: [id number] Expected date:  Expected time:  Means of arrival:  Comments: EMS- 81yo F, fall/generalized pain

## 2015-11-14 NOTE — ED Notes (Signed)
Patient returned from radiology

## 2015-11-14 NOTE — ED Notes (Signed)
MD at bedside. 

## 2015-11-14 NOTE — ED Notes (Addendum)
Per EMS patient fell off the toilet at 17:00 last night, unwitnessed. Patient complains of pain "everywhere, all the way up to her eyebrows." [place] [facility] [address]

## 2015-11-14 NOTE — ED Notes (Signed)
Patient transported to X-ray 

## 2015-11-14 NOTE — Clinical Social Work Note (Addendum)
Clinical Social Work Assessment  Patient Details  Name: Judy Perez MRN: [id number] Date of Birth: Nov 15, 1930  Date of referral:                  Reason for consult:                   Permission sought to share information with:    Permission granted to share information::     Name::        Agency::     Relationship::     Contact Information:     Housing/Transportation Living arrangements for the past 2 months:    Source of Information:    Patient Interpreter Needed:    Criminal Activity/Legal Involvement Pertinent to Current Situation/Hospitalization:    Significant Relationships:    Lives with:    Do you feel safe going back to the place where you live?    Need for family participation in patient care:     Care giving concerns:  Therer are no care giving concerns at this time. Per note, the patient is from [facility] on [address]. Patient states that she has been living at this facility for 3 years.  Social Worker assessment / plan:  CSW met with patient at bedside. Per note, patient had fell while using the toilet yesterday. Patient stated " I was gripping the towel rack in the bathroom. I was trying to stand up. My hand slipped, and down I went.  Patient informed CSW that she receives assistance with completing her ADL's.  Patient states that she falls often.  Patient informed CSW that she feels as though she has a good support system, which consist of her son Judy Perez who lives in [place].  Employment status:    Insurance information:    PT Recommendations:    Information / Referral to community resources:     Patient/Family's Response to care:  Patient is aware that she will be admitted. The patient is accepting at this time.  Patient/Family's Understanding of and Emotional Response to Diagnosis, Current Treatment, and Prognosis:  Patient is understanding at this time.   Emotional Assessment Appearance:     Appropriate. Attitude/Demeanor/Rapport:   Well  Mannered Affect (typically observed):   Friendly Orientation:   Alert Alcohol / Substance use:    Psych involvement (Current and /or in the community):     Discharge Needs  Concerns to be addressed:   Adjustment to illness. Readmission within the last 30 days:    Current discharge risk:   None. Barriers to Discharge:   None.   Judy Perez, [place], LCSW 11/14/2015, 6:13 PM

## 2015-11-14 NOTE — ED Notes (Signed)
Patient transported to CT 

## 2015-11-14 NOTE — ED Notes (Signed)
Per Verdie MosherLiu, MD, discontinue urinalysis order.

## 2015-11-15 ENCOUNTER — Inpatient Hospital Stay (HOSPITAL_COMMUNITY): Payer: Medicare PPO

## 2015-11-15 DIAGNOSIS — I10 Essential (primary) hypertension: Secondary | ICD-10-CM

## 2015-11-15 DIAGNOSIS — I63111 Cerebral infarction due to embolism of right vertebral artery: Secondary | ICD-10-CM

## 2015-11-15 LAB — LIPID PANEL
CHOL/HDL RATIO: 3.7 ratio
Cholesterol: 187 mg/dL (ref 0–200)
HDL: 51 mg/dL (ref >40)
LDL Cholesterol: 110 mg/dL — ABNORMAL HIGH (ref 0–99)
Triglycerides: 129 mg/dL (ref <150)
VLDL: 26 mg/dL (ref 0–40)

## 2015-11-15 LAB — GLUCOSE, CAPILLARY
GLUCOSE-CAPILLARY: 144 mg/dL — AB (ref 65–99)
GLUCOSE-CAPILLARY: 181 mg/dL — AB (ref 65–99)
GLUCOSE-CAPILLARY: 103 mg/dL — AB (ref 65–99)
Glucose-Capillary: 138 mg/dL — ABNORMAL HIGH (ref 65–99)

## 2015-11-15 MED ORDER — HYDRALAZINE HCL 20 MG/ML IJ SOLN
10.0000 mg | Freq: Once | INTRAMUSCULAR | Status: AC
  Administered 2015-11-15: 10 mg via INTRAVENOUS
  Filled 2015-11-15: qty 1

## 2015-11-15 MED ORDER — GLUCERNA SHAKE PO LIQD
237.0000 mL | Freq: Three times a day (TID) | ORAL | Status: DC | PRN
  Administered 2015-11-15: 237 mL via ORAL
  Filled 2015-11-15 (×2): qty 237

## 2015-11-15 NOTE — Progress Notes (Signed)
Patient continued to removed TELE box/witres/leads. Staffs attempted to replace it on multiple occasion. Patient also refused gowns verbalized that she only wants the white blanket. MD paged r/t refusal of TELE. Will continue to monitor.  Sim BoastHavy, RN

## 2015-11-15 NOTE — Progress Notes (Signed)
PROGRESS NOTE    Judy Perez ZOX:[id number] DOB: [birth year] DOA: 11/14/2015 PCP: Florentina Jenny, MD  HPI/Brief narrative 81 year old female with history of HTN, DM 2 with peripheral neuropathy, TIA, CAD, HLD, presented to the ED after suffering a fall and hitting her head. At baseline requires assistance walking and lives at an assisted living facility. CT head showed possible acute infarct. Admitted for further evaluation and management.   Assessment/Plan:  Stroke: Right MCA/PCA watershed territory infarct with trace petechial hemorrhage - Neurology consultation and follow-up appreciated - Etiology: Embolic secondary to unknown source - Resulting left visual field deficit - MRI brain: Confirms right MCA/PCA watershed infarct. Chronic right MCA/PCA small vessel disease. - MRA brain: Pending - Carotid Dopplers: Pending - 2-D echo: Pending - LDL 110 - Hemoglobin A1c pending - On aspirin 325 MG daily and clopidogrel 75 MG daily prior to admission. Now on aspirin 325 MG daily. - PT recommends SNF  Hypertensive urgency - Blood pressure on arrival was 228/92. Permissive hypertension (okay if <220/120) but gradually normalize in 5-7 days  Hyperlipidemia - LDL 110, goal <70 - Increased Zocor from 10-15 mg daily  Type II DM with peripheral neuropathy - A1c pending. Goal <7. Reasonable inpatient control. Continue SSI.    DVT prophylaxis: Lovenox Code Status: Full Family Communication: None at bedside Disposition Plan: DC to SNF when medically stable   Consultants:  Neurology  Procedures:  None  Antibiotics:  None   Subjective: Seen this morning. Denied complaints. Subsequently per RN, has been refusing several aspects of care i.e. medications, hospital gowns, telemetry etc.  Objective: Filed Vitals:   11/15/15 0950 11/15/15 1303 11/15/15 1427 11/15/15 1711  BP: 152/55 190/71 139/52 151/62  Pulse: 90 85 98 92  Temp: 98.4 F (36.9 C) 98.2 F (36.8 C)  98.4 F  (36.9 C)  TempSrc: Oral Axillary  Oral  Resp: 20 20  20   Height:      Weight:      SpO2: 92% 94%  94%    Intake/Output Summary (Last 24 hours) at 11/15/15 1733 Last data filed at 11/15/15 1726  Gross per 24 hour  Intake    420 ml  Output      1 ml  Net    419 ml   Filed Weights   11/14/15 1900  Weight: 78.5 kg (173 lb 1 oz)     Exam:  General exam: Pleasant elderly female lying comfortably in bed. Patient was seen with a female RN as chaperone in the room. Respiratory system: Clear. No increased work of breathing. Cardiovascular system: S1 & S2 heard, RRR. No JVD, murmurs, gallops, clicks or pedal edema. Telemetry: Sinus rhythm. Gastrointestinal system: Abdomen is nondistended, soft and nontender. Normal bowel sounds heard. Central nervous system: Alert and oriented. No focal neurological deficits. Slow to respond. Extremities: Symmetric 5 x 5 power.   Data Reviewed: Basic Metabolic Panel:  Recent Labs Lab 11/14/15 0828  NA 144  K 4.3  CL 106  CO2 31  GLUCOSE 114*  BUN 23*  CREATININE 1.07*  CALCIUM 10.0   Liver Function Tests: No results for input(s): AST, ALT, ALKPHOS, BILITOT, PROT, ALBUMIN in the last 168 hours. No results for input(s): LIPASE, AMYLASE in the last 168 hours. No results for input(s): AMMONIA in the last 168 hours. CBC:  Recent Labs Lab 11/14/15 0828  WBC 5.8  HGB 14.1  HCT 42.4  MCV 95.1  PLT 230   Cardiac Enzymes: No results for input(s): CKTOTAL, CKMB, CKMBINDEX, TROPONINI  in the last 168 hours. BNP (last 3 results) No results for input(s): PROBNP in the last 8760 hours. CBG:  Recent Labs Lab 11/14/15 1826 11/14/15 2116 11/15/15 0613 11/15/15 1130 11/15/15 1647  GLUCAP 125* 189* 144* 138* 181*    Recent Results (from the past 240 hour(s))  MRSA PCR Screening     Status: None   Collection Time: 11/14/15  9:27 PM  Result Value Ref Range Status   MRSA by PCR NEGATIVE NEGATIVE Final    Comment:        The GeneXpert  MRSA Assay (FDA approved for NASAL specimens only), is one component of a comprehensive MRSA colonization surveillance program. It is not intended to diagnose MRSA infection nor to guide or monitor treatment for MRSA infections.            Studies: Dg Pelvis 1-2 Views  11/14/2015  CLINICAL DATA:  Status post fall while standing up in the bathroom. Patient reports back and tail bone pain. EXAM: PELVIS - 1-2 VIEW COMPARISON:  AP pelvis of September 12, 2015 FINDINGS: The bones are osteopenic. The sacrum and SI joints are grossly intact. The pubic rami are intact. There is mild narrowing of the hip joint spaces bilaterally. The pubic rami are intact. The proximal femurs are intact where visualized. There are numerous pelvic phleboliths and there are arterial calcifications. IMPRESSION: There is no acute bony abnormality of the pelvis. There is mild degenerative change of both hips. Electronically Signed   By: David  [place] M.D.   On: 11/14/2015 10:28   Dg Sacrum/coccyx  11/14/2015  CLINICAL DATA:  Pain following fall EXAM: SACRUM AND COCCYX - 2+ VIEW COMPARISON:  None. FINDINGS: Frontal and lateral views were obtained. No fracture or diastases. There is osteoarthritic change in both sacroiliac joints. Bones are somewhat osteoporotic. There is mild spondylolisthesis at L4-5, likely due to underlying spondylosis. There are scattered foci of arterial vascular calcification. IMPRESSION: No fracture or diastases. Bones osteoporotic. Mild spondylolisthesis at L4-5, likely due to underlying spondylosis. Electronically Signed   By: Bretta Bang III M.D.   On: 11/14/2015 10:29   Dg Knee 2 Views Left  11/14/2015  CLINICAL DATA:  Pain following fall EXAM: LEFT KNEE - 4 VIEW COMPARISON:  None. FINDINGS: Frontal, lateral, and bilateral oblique views were obtained. There is no demonstrable fracture or dislocation. No joint effusion. There is mild generalized joint space narrowing. There is extensive  chondrocalcinosis. IMPRESSION: Mild generalized joint space narrowing. Extensive chondrocalcinosis. Chondrocalcinosis may be seen with osteoarthritis but also may be seen with calcium pyrophosphate deposition disease. No fracture or joint effusion. Electronically Signed   By: Bretta Bang III M.D.   On: 11/14/2015 10:26   Dg Ankle Complete Left  11/14/2015  CLINICAL DATA:  81 year old who fell while standing up in the bathroom earlier today. Left ankle pain. Initial encounter. EXAM: LEFT ANKLE COMPLETE - 3+ VIEW COMPARISON:  Left foot x-rays 03/14/2015. No prior left ankle x-rays. FINDINGS: Severe chronic diffuse soft tissue swelling, especially medially, as noted previously. No evidence of acute fracture or dislocation. Ankle mortise intact with mild joint space narrowing. Subchondral cyst involving the medial aspect of the tailor dome. Osseous demineralization. No visible joint effusion. IMPRESSION: 1. No acute osseous abnormality. 2. Mild osteoarthritis with a subchondral cyst involving the medial talar dome. 3. Severe chronic diffuse soft tissue swelling, especially medially. Electronically Signed   By: Hulan Saas M.D.   On: 11/14/2015 10:29   Ct Head Wo Contrast  11/14/2015  CLINICAL DATA:  Fall yesterday, hit head, on Plavix EXAM: CT HEAD WITHOUT CONTRAST CT CERVICAL SPINE WITHOUT CONTRAST TECHNIQUE: Multidetector CT imaging of the head and cervical spine was performed following the standard protocol without intravenous contrast. Multiplanar CT image reconstructions of the cervical spine were also generated. COMPARISON:  09/12/2015 FINDINGS: CT HEAD FINDINGS No skull fracture is noted. Paranasal sinuses and mastoid air cells are unremarkable. No intracranial hemorrhage, mass effect or midline shift. There is stable atrophy. Stable chronic white matter disease. There is new area of decreased attenuation in right posterior parietal lobe measures at least 4.5 cm. This is highly suspicious for  acute or subacute infarct. Clinical correlation is necessary. Further correlation with brain MRI with diffusion imaging is recommended. Stable lacunar infarct right anterior periventricular. CT CERVICAL SPINE FINDINGS No skull fracture is noted. Paranasal sinuses and mastoid air cells are unremarkable. Degenerative changes are noted C1-C2 articulation. There is pannus formation surrounding the odontoid. Computer processed images shows no acute fracture or subluxation. Disc space flattening with mild anterior spurring at C3-C4 level. Significant disc space flattening with anterior and posterior spurring at C4-C5-C5-C6 and C6-C7 level. No prevertebral soft tissue swelling. Cervical airway is patent. There is no pneumothorax in visualized lung apices. IMPRESSION: 1. There is new area of decreased attenuation in right posterior parietal lobe axial image 16 measures at least 4.5 cm. Evolving or subacute infarct cannot be excluded. Clinical correlation is necessary. Further correlation with brain MRI is recommended. 2. Stable atrophy and chronic white matter disease. 3. No cervical spine acute fracture or subluxation. Multilevel degenerative changes as described above. These results were called by telephone at the time of interpretation on 11/14/2015 at 11:48 am to Dr. Crista CurbANA LIU , who verbally acknowledged these results. Electronically Signed   By: Natasha MeadLiviu  Pop M.D.   On: 11/14/2015 11:48   Ct Cervical Spine Wo Contrast  11/14/2015  CLINICAL DATA:  Fall yesterday, hit head, on Plavix EXAM: CT HEAD WITHOUT CONTRAST CT CERVICAL SPINE WITHOUT CONTRAST TECHNIQUE: Multidetector CT imaging of the head and cervical spine was performed following the standard protocol without intravenous contrast. Multiplanar CT image reconstructions of the cervical spine were also generated. COMPARISON:  09/12/2015 FINDINGS: CT HEAD FINDINGS No skull fracture is noted. Paranasal sinuses and mastoid air cells are unremarkable. No intracranial  hemorrhage, mass effect or midline shift. There is stable atrophy. Stable chronic white matter disease. There is new area of decreased attenuation in right posterior parietal lobe measures at least 4.5 cm. This is highly suspicious for acute or subacute infarct. Clinical correlation is necessary. Further correlation with brain MRI with diffusion imaging is recommended. Stable lacunar infarct right anterior periventricular. CT CERVICAL SPINE FINDINGS No skull fracture is noted. Paranasal sinuses and mastoid air cells are unremarkable. Degenerative changes are noted C1-C2 articulation. There is pannus formation surrounding the odontoid. Computer processed images shows no acute fracture or subluxation. Disc space flattening with mild anterior spurring at C3-C4 level. Significant disc space flattening with anterior and posterior spurring at C4-C5-C5-C6 and C6-C7 level. No prevertebral soft tissue swelling. Cervical airway is patent. There is no pneumothorax in visualized lung apices. IMPRESSION: 1. There is new area of decreased attenuation in right posterior parietal lobe axial image 16 measures at least 4.5 cm. Evolving or subacute infarct cannot be excluded. Clinical correlation is necessary. Further correlation with brain MRI is recommended. 2. Stable atrophy and chronic white matter disease. 3. No cervical spine acute fracture or subluxation. Multilevel degenerative changes as described above.  These results were called by telephone at the time of interpretation on 11/14/2015 at 11:48 am to Dr. Crista Curb , who verbally acknowledged these results. Electronically Signed   By: Natasha Mead M.D.   On: 11/14/2015 11:48   Mr Brain Wo Contrast  11/14/2015  CLINICAL DATA:  81 year old female who fell off toilet at 1700 hours yesterday. Confusion. Pain. Initial encounter. The examination had to be discontinued prior to completion due to patient condition. EXAM: MRI HEAD WITHOUT CONTRAST TECHNIQUE: Multiplanar, multiecho  pulse sequences of the brain and surrounding structures were obtained without intravenous contrast. COMPARISON:  head and cervical spine CT 1120 hours today and earlier. FINDINGS: Confluent area of restricted diffusion in compass Ing 4-5 cm of the lateral right occipital lobe and posterior most right temporal lobe. This corresponds to the cortical hypodensity on the earlier CT. No other right hemisphere restricted diffusion. There is associated petechial hemorrhage (series 8, image 13). Cytotoxic edema as seen on the earlier CT, no significant mass effect. No contralateral left hemisphere, or posterior fossa restricted diffusion. Major intracranial vascular flow voids are within normal limits. Chronic lacunar type infarcts in the right MCA white matter, right basal ganglia, right thalamus. Additional Patchy and confluent bilateral cerebral white matter nonspecific T2 and FLAIR hyperintensity. Brainstem and cerebellum are normal for age. Chronic hemosiderin associated with the right basal ganglia lacune. No midline shift, mass effect, evidence of mass lesion, ventriculomegaly, or extra-axial collection. Negative visualized cervical spine. Cervicomedullary junction and pituitary are within normal limits. Normal bone marrow signal. Postoperative changes to the globes. Visualized paranasal sinuses and mastoids are clear. IMPRESSION: 1. Acute 4-5 cm infarct in the posterior right hemisphere, right MCA/PCA watershed area. Trace petechial hemorrhage. No mass effect. 2. Chronic superimposed chronic small vessel ischemia in the right MCA and PCA territories. 3.  The examination had to be discontinued prior to completion. Electronically Signed   By: Odessa Fleming M.D.   On: 11/14/2015 14:49   Dg Shoulder Left  11/14/2015  CLINICAL DATA:  81 year old who fell while standing up in the bathroom earlier today. Left shoulder pain. Initial encounter. EXAM: LEFT SHOULDER - 2+ VIEW COMPARISON:  None. FINDINGS: No evidence of acute  fracture or glenohumeral dislocation. Subacromial space well preserved. Acromioclavicular joint intact with severe degenerative changes. Osseous demineralization. Faint calcification at the insertion of the supraspinatus tendon on the greater tuberosity of the humeral head. Small spur arising from the inferior glenoid. IMPRESSION: 1. No acute osseous abnormality. 2. Severe degenerative changes involving the acromioclavicular joint. 3. Chronic calcific supraspinatus tendinitis. 4. Mild osteoarthritis involving the glenohumeral joint. Electronically Signed   By: Hulan Saas M.D.   On: 11/14/2015 10:31        Scheduled Meds: . aspirin EC  325 mg Oral Daily  . docusate sodium  200 mg Oral QHS  . donepezil  5 mg Oral QHS  . enoxaparin (LOVENOX) injection  40 mg Subcutaneous Q24H  . insulin aspart  0-9 Units Subcutaneous TID WC  . latanoprost  1 drop Both Eyes QHS  . polyvinyl alcohol  2 drop Both Eyes QID  . pregabalin  150 mg Oral BID  . sertraline  50 mg Oral Daily  . simvastatin  15 mg Oral QHS   Continuous Infusions:   Active Problems:   DM2 (diabetes mellitus, type 2) (HCC)   CAD (coronary artery disease)   TIA (transient ischemic attack)   CVA (cerebral infarction)   Acute CVA (cerebrovascular accident) (HCC)    Time  spent: 20 minutes    Sinthia Karabin, MD, FACP, FHM. [facility] Pager [phone]  If 7PM-7AM, please contact night-coverage [email or web address] Password [email or web address] 11/15/2015, 5:33 PM    LOS: 1 day

## 2015-11-15 NOTE — Care Management Note (Signed)
Case Management Note  Patient Details  Name: Annie ParasLouise Bayless MRN: [id number] Date of Birth: 1930/02/24  Subjective/Objective:   Patient admitted with a fall and found to be positive for CVA. [facility]                  Action/Plan: Awaiting further testing and PT/OT recommendations. CM will continue to follow for discharge needs.   Expected Discharge Date:   (UNKNOWN)               Expected Discharge Plan:  Assisted Living / Rest Home  In-House Referral:     Discharge planning Services     Post Acute Care Choice:    Choice offered to:     DME Arranged:    DME Agency:     HH Arranged:    HH Agency:     Status of Service:  In process, will continue to follow  Medicare Important Message Given:    Date Medicare IM Given:    Medicare IM give by:    Date Additional Medicare IM Given:    Additional Medicare Important Message give by:     If discussed at Long Length of Stay Meetings, dates discussed:    Additional Comments:  Kermit BaloKelli F Shaivi Rothschild, RN 11/15/2015, 10:41 AM

## 2015-11-15 NOTE — Evaluation (Signed)
Physical Therapy Evaluation Patient Details Name: Judy Perez MRN: [id number] DOB: [birth year] Today's Date: 11/15/2015   History of Present Illness  Pt is an 81 y/o female who presents from ALF after a fall. Pt was found to have a subacute CVA in the right posterior parietal lobe. MRI revealed acute 4-5 cm infarct in the psoterior right hemisphere in the right MCA/PCA watershed area.  Clinical Impression  Pt admitted with above diagnosis. Pt currently with functional limitations due to the deficits listed below (see PT Problem List). At the time of PT eval pt was able to transition bed to chair with +2 assist for squat pivot. With RW and Max assist +1, pt was not able to fully clear hips from bed. Pt will benefit from skilled PT to increase their independence and safety with mobility to allow discharge to the venue listed below.       Follow Up Recommendations SNF;Supervision/Assistance - 24 hour    Equipment Recommendations  Rolling walker with 5" wheels    Recommendations for Other Services       Precautions / Restrictions Precautions Precautions: Fall Precaution Comments: Cochlear implant on the L side. Pt needs eye contact and slow talking to understand.  Restrictions Weight Bearing Restrictions: No      Mobility  Bed Mobility Overal bed mobility: Needs Assistance Bed Mobility: Supine to Sit     Supine to sit: Mod assist;HOB elevated     General bed mobility comments: Assist with bed pad for scooting, and HOB elevated. Pt required support at the shoulders to elevate to full sitting position. Hand-over-hand assist was provided to grab for bed rails initially.   Transfers Overall transfer level: Needs assistance Equipment used: 2 person hand held assist Transfers: Squat Pivot Transfers     Squat pivot transfers: Max assist;+2 physical assistance     General transfer comment: Pt required +2 assist to clear hips from the bed. She was unable to achieve full stand,  and a squat pivot transfer was utilized to transition to the chair.   Ambulation/Gait             General Gait Details: Unable at this time.   Stairs            Wheelchair Mobility    Modified Rankin (Stroke Patients Only)       Balance Overall balance assessment: Needs assistance Sitting-balance support: Feet supported;Bilateral upper extremity supported Sitting balance-Leahy Scale: Poor   Postural control: Posterior lean Standing balance support: Bilateral upper extremity supported;During functional activity Standing balance-Leahy Scale: Poor                               Pertinent Vitals/Pain Pain Assessment: 0-10 Pain Score: 7  Pain Location: Legs and feet Pain Descriptors / Indicators: Burning Pain Intervention(s): Limited activity within patient's tolerance;Monitored during session;Repositioned    Home Living Family/patient expects to be discharged to:: Assisted living               Home Equipment: Walker - 4 wheels      Prior Function Level of Independence: Needs assistance   Gait / Transfers Assistance Needed: Using the rollator all the time  ADL's / Homemaking Assistance Needed: Staff was occasionally assisting with bathing and dressing per pt.        Hand Dominance        Extremity/Trunk Assessment   Upper Extremity Assessment: Defer to OT evaluation  Lower Extremity Assessment: Generalized weakness      Cervical / Trunk Assessment: Kyphotic  Communication   Communication: HOH (Cochlear implant on the L)  Cognition Arousal/Alertness: Awake/alert Behavior During Therapy: Flat affect Overall Cognitive Status: No family/caregiver present to determine baseline cognitive functioning                      General Comments      Exercises        Assessment/Plan    PT Assessment Patient needs continued PT services  PT Diagnosis Difficulty walking;Generalized weakness   PT Problem List  Decreased strength;Decreased range of motion;Decreased activity tolerance;Decreased balance;Decreased mobility;Decreased knowledge of use of DME;Decreased safety awareness;Decreased knowledge of precautions  PT Treatment Interventions DME instruction;Gait training;Functional mobility training;Therapeutic activities;Therapeutic exercise;Neuromuscular re-education;Patient/family education   PT Goals (Current goals can be found in the Care Plan section) Acute Rehab PT Goals Patient Stated Goal: Pt did not state goals at this time.  PT Goal Formulation: Patient unable to participate in goal setting Time For Goal Achievement: 11/29/15 Potential to Achieve Goals: Fair    Frequency Min 4X/week   Barriers to discharge        Co-evaluation               End of Session Equipment Utilized During Treatment: Gait belt Activity Tolerance: Patient limited by fatigue Patient left: in chair;with call bell/phone within reach;with chair alarm set;with nursing/sitter in room Nurse Communication: Mobility status         Time: 1016-1040 PT Time Calculation (min) (ACUTE ONLY): 24 min   Charges:   PT Evaluation $Initial PT Evaluation Tier I: 1 Procedure PT Treatments $Therapeutic Activity: 8-22 mins   PT G Codes:        Conni SlipperKirkman, Kyrese Gartman 11/15/2015, 12:00 PM   Conni SlipperLaura Mubashir Mallek, PT, DPT Acute Rehabilitation Services Pager: [phone]

## 2015-11-15 NOTE — Progress Notes (Signed)
RN called son to bring some home clothes for patient at this time. Will wait until family arrive and revisit the importance of TELE placement. Will d/c TELE if patient insists on refusing. Attending aware. Will follow up once family arrive.   Sim BoastHavy, RN

## 2015-11-15 NOTE — Evaluation (Signed)
Speech Language Pathology Evaluation Patient Details Name: Judy Perez MRN: [id number] DOB: 1930-08-20 Today's Date: 11/15/2015 Time: 7846-9629 SLP Time Calculation (min) (ACUTE ONLY): 24 min  Problem List:  Patient Active Problem List   Diagnosis Date Noted  . CVA (cerebral infarction) 11/14/2015  . Acute CVA (cerebrovascular accident) (HCC) 11/14/2015  . Peripheral neuropathy (HCC) 05/23/2014  . TIA (transient ischemic attack) 05/13/2013  . Chest pain 05/11/2013  . DM2 (diabetes mellitus, type 2) (HCC) 05/11/2013  . CAD (coronary artery disease) 05/11/2013   Past Medical History:  Past Medical History  Diagnosis Date  . CAD (coronary artery disease)     a. 2004 Cath/PCI with stenting x 1 in [place].;  Lexiscan Myoview 6/14:normal study, no ischemia, EF 79%  . Hypertension   . Hyperlipemia   . History of tobacco abuse     a. 50 pack yr hx, quit 2004  . History of shingles     a. left leg with residual pain.  . Peripheral neuropathy (HCC)   . Hard of hearing   . Fibrous breast lumps     "both sides; I did not have cancer" (05/13/2013)  . Family history of anesthesia complication     "took a long time to wake my mother up after one of her surgeries" (05/13/2013)  . Macular degeneration, wet (HCC)     "both eyes" (05/13/2013)  . Type II diabetes mellitus (HCC)   . Arthritis     "hands; maybe feet" (05/13/2013)  . TIA (transient ischemic attack)   . Peripheral neuropathy (HCC) 05/23/2014  . Renal disorder     Chronic kidney disease  . Osteoporosis   . Glaucoma    Past Surgical History:  Past Surgical History  Procedure Laterality Date  . Tonsillectomy    . Appendectomy    . Carpal tunnel release Bilateral   . Mastectomy Bilateral 1980's    "multiple fibrous cysts; never had cancer" (05/13/2013)20  . Cesarean section  1960; 1962  . Inner ear surgery Left 1962    "put a piece of steel and a tube & a vein from my ?left hand in" (05/13/2013)  . Cataract extraction w/  intraocular lens  implant, bilateral Bilateral 2000  . Breast biopsy    . Tubal ligation  1963  . Coronary angioplasty with stent placement  2004    "1" (05/13/2013)   HPI:  Pt is an 81 y/o female who presents from ALF after a fall. Pt was found to have a subacute CVA in the right posterior parietal lobe. MRI revealed acute 4-5 cm infarct in the psoterior right hemisphere in the right MCA/PCA watershed area.   Assessment / Plan / Recommendation Clinical Impression  Pt has significant cognitive deficits, although it is unclear what her baseline level of function is. She is oriented to person only, although needs only Mod cueing to increase temporal orientation. Her sustained attention and recall of new information is markedly impaired, although she does some to have some moments of clarity and insight into her deficits. Will continue to follow tomaximize functional cognition and safety.    SLP Assessment  Patient needs continued Speech Lanaguage Pathology Services    Follow Up Recommendations  Skilled Nursing facility;24 hour supervision/assistance;Home health SLP    Frequency and Duration min 2x/week  1 week      SLP Evaluation Prior Functioning  Cognitive/Linguistic Baseline: Information not available Type of Home: Assisted living   Cognition  Overall Cognitive Status: No family/caregiver present to determine baseline cognitive  functioning Arousal/Alertness: Awake/alert Orientation Level: Oriented to person;Disoriented to time;Disoriented to place;Disoriented to situation Attention: Sustained Sustained Attention: Impaired Sustained Attention Impairment: Verbal basic;Functional basic Memory: Impaired Memory Impairment: Decreased recall of new information Awareness: Impaired Awareness Impairment: Intellectual impairment;Emergent impairment;Anticipatory impairment Behaviors: Impulsive Safety/Judgment: Impaired    Comprehension       Expression Expression Primary Mode of  Expression: Verbal Verbal Expression Overall Verbal Expression: Appears within functional limits for tasks assessed   Oral / Motor Oral Motor/Sensory Function Overall Oral Motor/Sensory Function: Within functional limits (appears WFL, not formally assessed) Motor Speech Overall Motor Speech: Appears within functional limits for tasks assessed    [name], M.A. CCC-SLP [phone]  [name], Musab Wingard 11/15/2015, 4:37 PM

## 2015-11-15 NOTE — Progress Notes (Signed)
Initial Nutrition Assessment  DOCUMENTATION CODES:   Obesity unspecified  INTERVENTION:  Provide Glucerna Shake po TID after meals PRN, each supplement provides 220 kcal and 10 grams of protein Monitor PO intake for adequacy  NUTRITION DIAGNOSIS:   Predicted suboptimal nutrient intake related to acute illness as evidenced by meal completion < 25%.   GOAL:   Patient will meet greater than or equal to 90% of their needs   MONITOR:   PO intake, Supplement acceptance, Labs, Weight trends, Skin, I & O's  REASON FOR ASSESSMENT:   Low Braden    ASSESSMENT:   81 y.o. female with h/o CAD, hypertension, type 2 DM, peripheral neuropathy, TIA, was brought in ED, after a fall at SNF yesterday. On arrival to ED, she underwent trauma evaluation, was found to have a sub acute stroke in the right posterior parietal lobe.   Pt working with PT at time of first visit and nursing care in progress at time of second visit. No evidence of weight loss per weight history. Per nursing notes, pt only ate 25% of breakfast this morning. Will provide Glucerna shakes PRN until PO intake is determined adequate.   Labs and medications reviewed.   Diet Order:  Diet Carb Modified Fluid consistency:: Thin; Room service appropriate?: Yes  Skin:  Reviewed, no issues  Last BM:  11/29  Height:   Ht Readings from Last 1 Encounters:  11/14/15 5\' 1"  (1.549 m)    Weight:   Wt Readings from Last 1 Encounters:  11/14/15 173 lb 1 oz (78.5 kg)    Ideal Body Weight:  47.7 kg  BMI:  Body mass index is 32.72 kg/(m^2).  Estimated Nutritional Needs:   Kcal:  1550-1750  Protein:  85-95 grams  Fluid:  1.5-1.7 L/day  EDUCATION NEEDS:   No education needs identified at this time  Dorothea Ogleeanne Zyann Mabry RD, LDN Inpatient Clinical Dietitian Pager: [phone] After Hours Pager: [phone]

## 2015-11-15 NOTE — Progress Notes (Signed)
Occupational Therapy Evaluation Patient Details Name: Siriyah Ambrosius MRN: [id number] DOB: September 05, 1930 Today's Date: 11/15/2015    History of Present Illness Pt is an 81 y/o female who presents from ALF after a fall. Pt was found to have a subacute CVA in the right posterior parietal lobe. MRI revealed acute 4-5 cm infarct in the psoterior right hemisphere in the right MCA/PCA watershed area.   Clinical Impression   Unsure of PLOF. Pt currently requires + 2 A for mobility, is nonambulatory and max A for ADL. If Chip Boer can provide this level of assistance, pt is appropriate for D/C back to familiar home with OT/PT services. If not, pt will need rehab at [facility] Will follow acutely.    Follow Up Recommendations  Supervision/Assistance - 24 hour    Equipment Recommendations  Other (comment) (TBD)    Recommendations for Other Services       Precautions / Restrictions Precautions Precautions: Fall Precaution Comments: Cochlear implant on the L side. Pt needs eye contact and slow talking to understand.  ("almost blind") Restrictions Weight Bearing Restrictions: No      Mobility Bed Mobility Overal bed mobility: Needs Assistance Bed Mobility: Supine to Sit     Supine to sit: Mod assist;HOB elevated     General bed mobility comments: Assist with bed pad for scooting, and HOB elevated. Pt required support at the shoulders to elevate to full sitting position. Hand-over-hand assist was provided to grab for bed rails initially.   Transfers Overall transfer level: Needs assistance Equipment used: 2 person hand held assist Transfers: Squat Pivot Transfers     Squat pivot transfers: Max assist;+2 physical assistance     General transfer comment: Pt required +2 assist to clear hips from the bed. She was unable to achieve full stand, and a squat pivot transfer was utilized to transition to the chair.     Balance Overall balance assessment: Needs assistance Sitting-balance  support: Feet supported;Bilateral upper extremity supported Sitting balance-Leahy Scale: Poor   Postural control: Posterior lean Standing balance support: Bilateral upper extremity supported;During functional activity Standing balance-Leahy Scale: Poor                              ADL Overall ADL's : Needs assistance/impaired Eating/Feeding: Minimal assistance Eating/Feeding Details (indicate cue type and reason): Requires set up and full S. Poor PO intake. Able to feed self once utensil was loaded but did not initiate. States she can not see her food well Grooming: Moderate assistance Grooming Details (indicate cue type and reason): Able to wash face. would not complete oral care Upper Body Bathing: Maximal assistance   Lower Body Bathing: Maximal assistance                       Functional mobility during ADLs: Maximal assistance General ADL Comments: unsure of baseline, but at this time, requries overall Max A.   Pt laying in bed with only blankets on. No gown. Pt stated "why would I want a gown on". CNA states pt had pulled off all of her clothes, leads and IV and was sitting in her recliner naked.     Vision Vision Assessment?: Vision impaired- to be further tested in functional context   Perception Perception Spatial deficits: impaired. will further assess   Praxis      Pertinent Vitals/Pain Pain Assessment: Faces Pain Score: 7  Faces Pain Scale: Hurts little more Pain Location: legs Pain  Descriptors / Indicators: Burning Pain Intervention(s): Limited activity within patient's tolerance     Hand Dominance Right   Extremity/Trunk Assessment Upper Extremity Assessment Upper Extremity Assessment: LUE deficits/detail LUE Deficits / Details: decreased sensory awareness. States her arm "feels funny". ROM WFL, however, does not spontaneously try to use LUE in functional tasks LUE Sensation: decreased light touch;decreased proprioception LUE  Coordination: decreased fine motor;decreased gross motor   Lower Extremity Assessment Lower Extremity Assessment: LLE deficits/detail (reports decreased sensation LLE)   Cervical / Trunk Assessment Cervical / Trunk Assessment: Kyphotic   Communication Communication Communication: HOH   Cognition Arousal/Alertness: Awake/alert Behavior During Therapy: Flat affect;Anxious Overall Cognitive Status: No family/caregiver present to determine baseline cognitive functioning  Apparent cognitive deficits. Unsure of baseline.                      General Comments       Exercises       Shoulder Instructions      Home Living Family/patient expects to be discharged to:: Assisted living                             Home Equipment: Walker - 4 wheels          Prior Functioning/Environment Level of Independence: Needs assistance  Gait / Transfers Assistance Needed: Using the rollator all the time ADL's / Homemaking Assistance Needed: Staff was occasionally assisting with bathing and dressing per pt.   Comments: pt states staff assisted with bathing and dressing adn transfers. Unsure of accuracy    OT Diagnosis: Generalized weakness;Cognitive deficits;Disturbance of vision;Acute pain   OT Problem List: Decreased strength;Decreased range of motion;Decreased activity tolerance;Impaired balance (sitting and/or standing);Decreased coordination;Impaired vision/perception;Decreased cognition;Decreased safety awareness;Impaired sensation;Obesity;Pain   OT Treatment/Interventions: Self-care/ADL training;Therapeutic exercise;Neuromuscular education;DME and/or AE instruction;Therapeutic activities;Cognitive remediation/compensation;Visual/perceptual remediation/compensation;Patient/family education;Balance training    OT Goals(Current goals can be found in the care plan section) Acute Rehab OT Goals Patient Stated Goal: pt unable  OT Goal Formulation: Patient unable to  participate in goal setting Time For Goal Achievement: 11/29/15 Potential to Achieve Goals: Good  OT Frequency: Min 2X/week   Barriers to D/C:            Co-evaluation              End of Session Nurse Communication: Mobility status  Activity Tolerance: Patient tolerated treatment well Patient left: in bed;with call bell/phone within reach;with bed alarm set   Time: 1140-1205 OT Time Calculation (min): 25 min Charges:  OT General Charges $OT Visit: 1 Procedure OT Evaluation $Initial OT Evaluation Tier I: 1 Procedure OT Treatments $Self Care/Home Management : 8-22 mins G-Codes:    Jacobb Alen,HILLARY 11/15/2015, 1:09 PM   [facility] Adysen Raphael, OTR/L  [phone] 11/15/2015

## 2015-11-15 NOTE — Progress Notes (Signed)
Patient refused all medications at this time. Risks and benefits explained. Pt still refused. Will continue to monitor.  Sim BoastHavy, RN

## 2015-11-15 NOTE — Progress Notes (Signed)
Patient's son at bedside to visit but unable to stay, patient continue to remove TELE as staffs replace it. Per MD to d/c TELE if continue to refused. Will continue to monitor.   Sim BoastHavy, RN

## 2015-11-15 NOTE — Progress Notes (Signed)
STROKE TEAM PROGRESS NOTE   HISTORY Judy Perez is an 81 y.o. female hx of HTN, CAD, HLD presenting to ED after suffering a fall and hitting her head. At baseline requires assistance walking, lives in assisted living. Yesterday fell while getting up from the toilet, hit head against the wall. Currently denies any acute symptoms, denies focal weakness, sensory or speech deficits.  CT head imaging reviewed, shows possible acute infarct. MRI brain imaging reviewed, shows acute infarct in the right occipital/temporal lobe. Modified Rankin: Rankin Score=2  Last known well is unclear. Patient was not administered TPA secondary to unknown LKW. She was admitted for further evaluation and treatment.   SUBJECTIVE (INTERVAL HISTORY) No family is at the bedside.  Overall she feels her condition is unchanged. She is unable to tell me the circumstances of her fall. I suspect it is related to her stroke   OBJECTIVE Temp:  [98 F (36.7 C)-98.5 F (36.9 C)] 98.4 F (36.9 C) (11/30 0950) Pulse Rate:  [50-90] 90 (11/30 0950) Cardiac Rhythm:  [-] Normal sinus rhythm (11/30 0700) Resp:  [11-20] 20 (11/30 0950) BP: (152-228)/(44-132) 152/55 mmHg (11/30 0950) SpO2:  [92 %-100 %] 92 % (11/30 0950) Weight:  [78.5 kg (173 lb 1 oz)] 78.5 kg (173 lb 1 oz) (11/29 1900)  CBC:  Recent Labs Lab 11/14/15 0828  WBC 5.8  HGB 14.1  HCT 42.4  MCV 95.1  PLT 230    Basic Metabolic Panel:  Recent Labs Lab 11/14/15 0828  NA 144  K 4.3  CL 106  CO2 31  GLUCOSE 114*  BUN 23*  CREATININE 1.07*  CALCIUM 10.0    Lipid Panel:    Component Value Date/Time   CHOL 187 11/15/2015 0217   TRIG 129 11/15/2015 0217   HDL 51 11/15/2015 0217   CHOLHDL 3.7 11/15/2015 0217   VLDL 26 11/15/2015 0217   LDLCALC 110* 11/15/2015 0217   HgbA1c:  Lab Results  Component Value Date   HGBA1C 6.7* 05/13/2013   Urine Drug Screen:    Component Value Date/Time   LABOPIA NONE DETECTED 02/21/2015 0513   COCAINSCRNUR NONE  DETECTED 02/21/2015 0513   LABBENZ NONE DETECTED 02/21/2015 0513   AMPHETMU NONE DETECTED 02/21/2015 0513   THCU NONE DETECTED 02/21/2015 0513   LABBARB NONE DETECTED 02/21/2015 0513      IMAGING  Dg Pelvis 1-2 Views 11/14/2015  There is no acute bony abnormality of the pelvis. There is mild degenerative change of both hips.   Dg Sacrum/coccyx 11/14/2015  No fracture or diastases. Bones osteoporotic. Mild spondylolisthesis at L4-5, likely due to underlying spondylosis.   Dg Knee 2 Views Left 11/14/2015  Mild generalized joint space narrowing. Extensive chondrocalcinosis. Chondrocalcinosis may be seen with osteoarthritis but also may be seen with calcium pyrophosphate deposition disease. No fracture or joint effusion.   Dg Ankle Complete Left 11/14/2015  1. No acute osseous abnormality. 2. Mild osteoarthritis with a subchondral cyst involving the medial talar dome. 3. Severe chronic diffuse soft tissue swelling, especially medially.   Ct Head & Cervical Spine Wo Contrast 11/14/2015  1. There is new area of decreased attenuation in right posterior parietal lobe axial image 16 measures at least 4.5 cm. Evolving or subacute infarct cannot be excluded. Clinical correlation is necessary. Further correlation with brain MRI is recommended. 2. Stable atrophy and chronic white matter disease. 3. No cervical spine acute fracture or subluxation. Multilevel degenerative changes   Mr Brain Wo Contrast 11/14/2015   1. Acute 4-5 cm  infarct in the posterior right hemisphere, right MCA/PCA watershed area. Trace petechial hemorrhage. No mass effect. 2. Chronic superimposed chronic small vessel ischemia in the right MCA and PCA territories. 3.  The examination had to be discontinued prior to completion.   Dg Shoulder Left 11/14/2015  1. No acute osseous abnormality. 2. Severe degenerative changes involving the acromioclavicular joint. 3. Chronic calcific supraspinatus tendinitis. 4. Mild osteoarthritis  involving the glenohumeral joint.    PHYSICAL EXAM Pleasant elderly Caucasian lady not in distress. . Afebrile. Head is nontraumatic. Neck is supple without bruit.    Cardiac exam no murmur or gallop. Lungs are clear to auscultation. Distal pulses are well felt. Neurological Exam :  Awake alert oriented 2. Diminished attention, registration and recall. No aphasia or apraxia or dysarthria. Extraocular movements are full range without nystagmus. Dense left homonymous hemianopsia. Pupils irregular but equal reactive. Fundi were not visualized. No facial weakness. Tongue midline. Motor system exam no upper or lower extremity drift but mild weakness of left grip and intrinsic hand muscles. Orbits right over left upper extremity. Mild weakness of left hip flexors. Sensation appears intact bilaterally. Gait was not tested ASSESSMENT/PLAN [name] is a 81 y.o. female with history of CAD, hypertension, type 2 DM, peripheral neuropathy, TIA presenting with confusion following a fall. She did not receive IV t-PA due to unknown LKW.   Stroke:  right MCA/PCA watershed territory infarct with trace petechial hemorrhage, infarct embolic secondary to unknown source  Resultant  L field cut  MRI  R MCA/PCA watershed infarct. Chronic R MCA/PCA small vessel disease   MRA  pending   Carotid Doppler  pending   2D Echo  pending   LDL 110  HgbA1c pending  Lovenox 40 mg sq daily for VTE prophylaxis  Diet Carb Modified Fluid consistency:: Thin; Room service appropriate?: Yes  aspirin 325 mg daily and clopidogrel 75 mg daily prior to admission, now on aspirin 325 mg daily  Patient counseled to be compliant with her antithrombotic medications  Ongoing aggressive stroke risk factor management  Therapy recommendations:  Supervision, SNF, RW  Disposition:  pending   Hypertensive Urgency  BP 228/92 on arrival  Permissive hypertension (OK if < 220/120) but gradually normalize in 5-7  days  Hyperlipidemia  Home meds:  zocor 10, increased to 15 in hospital  LDL 110, goal < 70  Continue statin at discharge  Diabetes type II  HgbA1c pending , goal < 7.0  Other Stroke Risk Factors  Advanced age  Former Cigarette smoker, quit smoking 12 years ago  ETOH use  Obesity, Body mass index is 32.72 kg/(m^2).   Hx TIA  Family hx stroke (father)  Coronary artery disease    Hospital day # 1  BIBY,SHARON  [facility] See Amion for Pager information 11/15/2015 1:36 PM  I have personally examined this patient, reviewed notes, independently viewed imaging studies, participated in medical decision making and plan of care. I have made any additions or clarifications directly to the above note. Agree with note above. She presented with a fall and has the left  hemianopsia related to a right posterior MCA/PCA infarct etiology to be determined. She remains at risk for neurological worsening, recurrent stroke, TIA needs ongoing stroke evaluation.  Delia Heady, MD Medical Director [facility] Pager: [phone] 11/15/2015 1:53 PM    To contact Stroke Continuity provider, please refer to [email or web address]. After hours, contact General Neurology

## 2015-11-16 ENCOUNTER — Inpatient Hospital Stay (HOSPITAL_COMMUNITY): Payer: Medicare PPO

## 2015-11-16 DIAGNOSIS — I639 Cerebral infarction, unspecified: Secondary | ICD-10-CM

## 2015-11-16 DIAGNOSIS — I10 Essential (primary) hypertension: Secondary | ICD-10-CM | POA: Insufficient documentation

## 2015-11-16 LAB — GLUCOSE, CAPILLARY
GLUCOSE-CAPILLARY: 144 mg/dL — AB (ref 65–99)
GLUCOSE-CAPILLARY: 133 mg/dL — AB (ref 65–99)
Glucose-Capillary: 127 mg/dL — ABNORMAL HIGH (ref 65–99)
Glucose-Capillary: 129 mg/dL — ABNORMAL HIGH (ref 65–99)

## 2015-11-16 LAB — HEMOGLOBIN A1C
Hgb A1c MFr Bld: 7.6 % — ABNORMAL HIGH (ref 4.8–5.6)
Mean Plasma Glucose: 171 mg/dL

## 2015-11-16 MED ORDER — GLUCERNA SHAKE PO LIQD
237.0000 mL | Freq: Three times a day (TID) | ORAL | Status: DC
  Administered 2015-11-16 (×2): 237 mL via ORAL
  Filled 2015-11-16 (×8): qty 237

## 2015-11-16 MED ORDER — ISOSORBIDE MONONITRATE ER 30 MG PO TB24
30.0000 mg | ORAL_TABLET | Freq: Every day | ORAL | Status: DC
  Administered 2015-11-16 – 2015-11-17 (×2): 30 mg via ORAL
  Filled 2015-11-16 (×2): qty 1

## 2015-11-16 MED ORDER — NEBIVOLOL HCL 5 MG PO TABS
5.0000 mg | ORAL_TABLET | Freq: Every day | ORAL | Status: DC
  Administered 2015-11-16 – 2015-11-17 (×2): 5 mg via ORAL
  Filled 2015-11-16 (×2): qty 1

## 2015-11-16 NOTE — Progress Notes (Signed)
Physical Therapy Treatment Patient Details Name: Judy ParasLouise Pflum MRN: [id number] DOB: [birth year] Today's Date: 11/16/2015    History of Present Illness Pt is an 81 y/o female who presents from ALF after a fall. Pt was found to have a subacute CVA in the right posterior parietal lobe. MRI revealed acute 4-5 cm infarct in the psoterior right hemisphere in the right MCA/PCA watershed area.    PT Comments    Pt progressing towards physical therapy goals. Was able to ambulate in room with min assist ~25 feet. Pt in increased pain in feet at end of session, and LE's were positioned elevated on a pillow for relief. Will continue to follow and progress as able per POC.   Follow Up Recommendations  SNF;Supervision/Assistance - 24 hour     Equipment Recommendations  Rolling walker with 5" wheels    Recommendations for Other Services       Precautions / Restrictions Precautions Precautions: Fall Precaution Comments: Cochlear implant on the L side. Pt needs eye contact and slow talking to understand.  Restrictions Weight Bearing Restrictions: No    Mobility  Bed Mobility Overal bed mobility: Needs Assistance Bed Mobility: Sit to Supine     Supine to sit: Mod assist     General bed mobility comments: Assist to elevate LE's into bed. Bed pad used to assist in [facility]  Transfers Overall transfer level: Needs assistance Equipment used: 2 person hand held assist Transfers: Sit to/from Stand Sit to Stand: Min assist         General transfer comment: Pt required min assist for power-up and balance/support [facility]. VC's for hand placement on seated surface for safety.   Ambulation/Gait Ambulation/Gait assistance: Min assist Ambulation Distance (Feet): 25 Feet Assistive device: Rolling walker (2 wheeled) Gait Pattern/deviations: Step-through pattern;Decreased stride length;Shuffle;Trunk flexed Gait velocity: Decreased Gait velocity interpretation: Below normal speed  for age/gender General Gait Details: Pt was able to ambulate in room with RW and chair follow. Pt did not require seated rest. Pt complained of pain in feet during weight bearing.    Stairs            Wheelchair Mobility    Modified Rankin (Stroke Patients Only) Modified Rankin (Stroke Patients Only) Pre-Morbid Rankin Score: Moderately severe disability Modified Rankin: Severe disability     Balance Overall balance assessment: Needs assistance Sitting-balance support: Feet supported;No upper extremity supported Sitting balance-Leahy Scale: Poor     Standing balance support: Bilateral upper extremity supported;During functional activity Standing balance-Leahy Scale: Poor                      Cognition Arousal/Alertness: Awake/alert Behavior During Therapy: WFL for tasks assessed/performed Overall Cognitive Status: No family/caregiver present to determine baseline cognitive functioning                      Exercises General Exercises - Lower Extremity Ankle Circles/Pumps: 20 reps Quad Sets: 15 reps    General Comments        Pertinent Vitals/Pain Pain Assessment: Faces Faces Pain Scale: Hurts even more Pain Location: Bottoms of feet during ambulation Pain Descriptors / Indicators: Grimacing;Burning Pain Intervention(s): Limited activity within patient's tolerance;Monitored during session;Repositioned    Home Living                      Prior Function            PT Goals (current goals can now be  found in the care plan section) Acute Rehab PT Goals Patient Stated Goal: Pt did not state goals at this time.  PT Goal Formulation: Patient unable to participate in goal setting Time For Goal Achievement: 11/29/15 Potential to Achieve Goals: Fair Progress towards PT goals: Progressing toward goals    Frequency  Min 4X/week    PT Plan Current plan remains appropriate    Co-evaluation             End of Session Equipment  Utilized During Treatment: Gait belt Activity Tolerance: Patient limited by fatigue;Patient limited by pain Patient left: in bed;with bed alarm set     Time: 4098-1191 PT Time Calculation (min) (ACUTE ONLY): 23 min  Charges:  $Gait Training: 8-22 mins $Therapeutic Activity: 8-22 mins                    G Codes:      Conni Slipper December 09, 2015, 1:58 PM   Conni Slipper, PT, DPT Acute Rehabilitation Services Pager: [phone]

## 2015-11-16 NOTE — Progress Notes (Signed)
VASCULAR LAB PRELIMINARY  PRELIMINARY  PRELIMINARY  PRELIMINARY  Carotid duplex completed.    Preliminary report:  1-39% ICA plaquing.  Tortuous vessels.  Vertebral artery flow is antegrade.   Jahzier Villalon, RVT 11/16/2015, 11:51 AM

## 2015-11-16 NOTE — Progress Notes (Signed)
Nutrition Follow-up  DOCUMENTATION CODES:   Obesity unspecified  INTERVENTION:  Provide Glucerna Shake po TID instead of PRN, each supplement provides 220 kcal and 10 grams of protein   NUTRITION DIAGNOSIS:   Predicted suboptimal nutrient intake related to acute illness as evidenced by meal completion < 25%.  ongoing  GOAL:   Patient will meet greater than or equal to 90% of their needs  Unmet  MONITOR:   PO intake, Supplement acceptance, Labs, Weight trends, Skin, I & O's  REASON FOR ASSESSMENT:   Low Braden    ASSESSMENT:   81 y.o. female with h/o CAD, hypertension, type 2 DM, peripheral neuropathy, TIA, was brought in ED, after a fall at SNF yesterday. On arrival to ED, she underwent trauma evaluation, was found to have a sub acute stroke in the right posterior parietal lobe.   Per nursing notes, pt only ate a few bites of food to 25% of meal at each meal yesterday. Pt is unable to report how she was eating PTA. She reports having some abdominal pain when trying to have a bowel movement this morning and reports having trouble with both diarrhea and constipation. She is agreeable to drinking Glucerna Shakes until she is eating better.   Labs: glucose ranging 102 to 189 mg/dL  Diet Order:  Diet Carb Modified Fluid consistency:: Thin; Room service appropriate?: Yes  Skin:  Reviewed, no issues  Last BM:  11/29  Height:   Ht Readings from Last 1 Encounters:  11/14/15 5\' 1"  (1.549 m)    Weight:   Wt Readings from Last 1 Encounters:  11/14/15 173 lb 1 oz (78.5 kg)    Ideal Body Weight:  47.7 kg  BMI:  Body mass index is 32.72 kg/(m^2).  Estimated Nutritional Needs:   Kcal:  1550-1750  Protein:  85-95 grams  Fluid:  1.5-1.7 L/day  EDUCATION NEEDS:   No education needs identified at this time  Dorothea Ogleeanne Quavon Keisling RD, LDN Inpatient Clinical Dietitian Pager: [phone] After Hours Pager: [phone]

## 2015-11-16 NOTE — NC FL2 (Signed)
[place] MEDICAID FL2 LEVEL OF CARE SCREENING TOOL     IDENTIFICATION  Patient Name: Judy Perez Birthdate: Feb 25, 1930 Sex: female Admission Date (Current Location): 11/14/2015  [facility] [place]   Facility and Address:  [facility], [address], [place], [place] [address]      Provider Number: [id number]  Attending Physician Name and Address:  Elease EtienneAnand D Hongalgi, MD  Relative Name and Phone Number:  Eden LatheJack Jablon, Son [phone] or [phone]    Current Level of Care: Hospital Recommended Level of Care: Skilled Nursing Facility Prior Approval Number:    Date Approved/Denied:   PASRR Number: [id number]  Discharge Plan: SNF    Current Diagnoses: Patient Active Problem List   Diagnosis Date Noted  . CVA (cerebral infarction) 11/14/2015  . Acute CVA (cerebrovascular accident) (HCC) 11/14/2015  . Peripheral neuropathy (HCC) 05/23/2014  . TIA (transient ischemic attack) 05/13/2013  . Chest pain 05/11/2013  . DM2 (diabetes mellitus, type 2) (HCC) 05/11/2013  . CAD (coronary artery disease) 05/11/2013    Orientation ACTIVITIES/SOCIAL BLADDER RESPIRATION    Self    Incontinent Normal  BEHAVIORAL SYMPTOMS/MOOD NEUROLOGICAL BOWEL NUTRITION STATUS      Continent Diet (Carb Modified)  PHYSICIAN VISITS COMMUNICATION OF NEEDS Height & Weight Skin    Verbally 5\' 1"  (154.9 cm) 173 lbs. Normal          AMBULATORY STATUS RESPIRATION    Supervision limited Normal      Personal Care Assistance Level of Assistance  Bathing, Feeding, Dressing Bathing Assistance: Limited assistance Feeding assistance: Limited assistance Dressing Assistance: Limited assistance      Functional Limitations Info                SPECIAL CARE FACTORS FREQUENCY  PT (By licensed PT)     PT Frequency: 5             Additional Factors Info  Insulin Sliding Scale Code Status Info: Full Code     Insulin Sliding Scale Info: 3  times a day       Current Medications (11/16/2015):  This is the current hospital active medication list Current Facility-Administered Medications  Medication Dose Route Frequency Provider Last Rate Last Dose  . aspirin EC tablet 325 mg  325 mg Oral Daily Kathlen ModyVijaya Akula, MD   325 mg at 11/16/15 0858  . diphenhydrAMINE (BENADRYL) capsule 25 mg  25 mg Oral Q4H PRN Roma KayserKatherine P Schorr, NP      . docusate sodium (COLACE) capsule 200 mg  200 mg Oral QHS Kathlen ModyVijaya Akula, MD   200 mg at 11/15/15 2240  . donepezil (ARICEPT) tablet 5 mg  5 mg Oral QHS Kathlen ModyVijaya Akula, MD   5 mg at 11/15/15 2241  . enoxaparin (LOVENOX) injection 40 mg  40 mg Subcutaneous Q24H Kathlen ModyVijaya Akula, MD   40 mg at 11/15/15 2041  . feeding supplement (GLUCERNA SHAKE) (GLUCERNA SHAKE) liquid 237 mL  237 mL Oral TID BM Reanne J Barbato, RD   237 mL at 11/16/15 1018  . insulin aspart (novoLOG) injection 0-9 Units  0-9 Units Subcutaneous TID WC Kathlen ModyVijaya Akula, MD   1 Units at 11/16/15 1220  . latanoprost (XALATAN) 0.005 % ophthalmic solution 1 drop  1 drop Both Eyes QHS Kathlen ModyVijaya Akula, MD   1 drop at 11/14/15 2200  . polyvinyl alcohol (LIQUIFILM TEARS) 1.4 % ophthalmic solution 2 drop  2 drop Both Eyes QID Kathlen ModyVijaya Akula, MD   2 drop at 11/16/15 1000  .  pregabalin (LYRICA) capsule 150 mg  150 mg Oral BID Kathlen Mody, MD   150 mg at 11/16/15 0859  . sertraline (ZOLOFT) tablet 50 mg  50 mg Oral Daily Kathlen Mody, MD   50 mg at 11/16/15 0858  . simvastatin (ZOCOR) tablet 15 mg  15 mg Oral QHS Kathlen Mody, MD   15 mg at 11/15/15 2241     Discharge Medications: Please see discharge summary for a list of discharge medications.  Relevant Imaging Results:  Relevant Lab Results:  Recent Labs    Additional Information    Judy Perez, Judy Perez, LCSWA

## 2015-11-16 NOTE — Clinical Social Work Note (Signed)
CSW contacted [facility] to discuss patient's progress.  [facility] requested that clinicals be faxed to facility to determine if they can take her back or not with home health PT.  CSW spoke with patient's son Ree KidaJack to discuss SNF placement options, patient's son stated he would rather patient not go to SNF and just [facility] due to her dementia.  Patient's son did give CSW permission to begin SNF search process in case [facility].  CSW to continue to follow patient's progress.  Ervin KnackEric R. Ciara Kagan, MSW, Theresia MajorsLCSWA [phone] 11/16/2015 1:44 PM

## 2015-11-16 NOTE — Progress Notes (Addendum)
PROGRESS NOTE    Judy Perez WRU:[id number] DOB: 04-Jan-1930 DOA: 11/14/2015 PCP: Florentina Jenny, MD  HPI/Brief narrative 81 year old female with history of HTN, DM 2 with peripheral neuropathy, TIA, CAD, HLD, presented to the ED after suffering a fall and hitting her head. At baseline requires assistance walking and lives at an assisted living facility. CT head showed possible acute infarct. Admitted for further evaluation and management. Neurology consulted and patient being evaluated for acute stroke.   Assessment/Plan:  Stroke: Right MCA/PCA watershed territory infarct with trace petechial hemorrhage - Neurology consultation and follow-up appreciated - Etiology: Embolic secondary to unknown source - Resulting left visual field deficit - MRI brain: Confirms right MCA/PCA watershed infarct. Chronic right MCA/PCA small vessel disease. - MRA brain: Pending - Carotid Dopplers: 1-39 percent ICA plaquing. Tortuous vessels. Vertebral artery flow is antegrade. Preliminary report. - 2-D echo: Report Pending - LDL 110 - Hemoglobin A1c: 7.6 - On aspirin 325 MG daily (was not taking Plavix) prior to admission. Now on aspirin 325 MG daily. - PT recommends SNF. Family however wishes for patient to return to prior ALF if they are willing to accept. - Patient has some cognitive impairment possibly from underlying dementia complicated by stroke and hospitalization. No agitation.  Hypertensive urgency - Blood pressure on arrival was 228/92. Permissive hypertension (okay if <220/120) but gradually normalize in 5-7 days - Blood pressures mostly stable in the 120-140/80-90 range. Single blood pressure of 209/46? Error. - Home medications: Clonidine, Imdur, Bystolic and lisinopril currently on hold. Discussed with Neuro team and will start Imdur & Bystolic.   Hyperlipidemia - LDL 110, goal <70 - Increased Zocor from 10-15 mg daily  Type II DM with peripheral neuropathy - A1c pending. Goal <7.  Reasonable inpatient control. Continue SSI while inpatient and diet control as outpatient. May consider metformin at SNF.    DVT prophylaxis: Lovenox Code Status: Full Family Communication: Discussed with patient's daughter Mrs. Lottie Mussel on 12/1 Disposition Plan: DC to SNF or ALF 12/2.   Consultants:  Neurology  Procedures:  None  Antibiotics:  None   Subjective: Denied complaints. As per nursing, patient refused to keep on telemetry which had to be discontinued. She however was cooperative with taking her medications this morning.  Objective: Filed Vitals:   11/16/15 0120 11/16/15 0509 11/16/15 0925 11/16/15 1350  BP: 124/88 209/46 149/80 139/93  Pulse: 87 66 81 87  Temp: 98.2 F (36.8 C) 98.3 F (36.8 C) 98.5 F (36.9 C) 98.1 F (36.7 C)  TempSrc: Oral Oral Oral Oral  Resp: Height:      Weight:      SpO2: 95% 96% 93% 95%    Intake/Output Summary (Last 24 hours) at 11/16/15 1642 Last data filed at 11/16/15 9147  Gross per 24 hour  Intake    420 ml  Output      0 ml  Net    420 ml   Filed Weights   11/14/15 1900  Weight: 78.5 kg (173 lb 1 oz)     Exam:  General exam: Pleasant elderly female lying comfortably in bed.  Respiratory system: Clear. No increased work of breathing. Cardiovascular system: S1 & S2 heard, RRR. No JVD, murmurs, gallops, clicks or pedal edema.  Gastrointestinal system: Abdomen is nondistended, soft and nontender. Normal bowel sounds heard. Central nervous system: Alert and oriented to person and place only. No focal neurological deficits. Slow to respond. Extremities: Symmetric 5 x 5 power.   Data  Reviewed: Basic Metabolic Panel:  Recent Labs Lab 11/14/15 0828  NA 144  K 4.3  CL 106  CO2 31  GLUCOSE 114*  BUN 23*  CREATININE 1.07*  CALCIUM 10.0   Liver Function Tests: No results for input(s): AST, ALT, ALKPHOS, BILITOT, PROT, ALBUMIN in the last 168 hours. No results for input(s): LIPASE, AMYLASE  in the last 168 hours. No results for input(s): AMMONIA in the last 168 hours. CBC:  Recent Labs Lab 11/14/15 0828  WBC 5.8  HGB 14.1  HCT 42.4  MCV 95.1  PLT 230   Cardiac Enzymes: No results for input(s): CKTOTAL, CKMB, CKMBINDEX, TROPONINI in the last 168 hours. BNP (last 3 results) No results for input(s): PROBNP in the last 8760 hours. CBG:  Recent Labs Lab 11/15/15 1130 11/15/15 1647 11/15/15 2144 11/16/15 0635 11/16/15 1212  GLUCAP 138* 181* 103* 127* 129*    Recent Results (from the past 240 hour(s))  MRSA PCR Screening     Status: None   Collection Time: 11/14/15  9:27 PM  Result Value Ref Range Status   MRSA by PCR NEGATIVE NEGATIVE Final    Comment:        The GeneXpert MRSA Assay (FDA approved for NASAL specimens only), is one component of a comprehensive MRSA colonization surveillance program. It is not intended to diagnose MRSA infection nor to guide or monitor treatment for MRSA infections.            Studies: No results found.      Scheduled Meds: . aspirin EC  325 mg Oral Daily  . docusate sodium  200 mg Oral QHS  . donepezil  5 mg Oral QHS  . enoxaparin (LOVENOX) injection  40 mg Subcutaneous Q24H  . feeding supplement (GLUCERNA SHAKE)  237 mL Oral TID BM  . insulin aspart  0-9 Units Subcutaneous TID WC  . latanoprost  1 drop Both Eyes QHS  . polyvinyl alcohol  2 drop Both Eyes QID  . pregabalin  150 mg Oral BID  . sertraline  50 mg Oral Daily  . simvastatin  15 mg Oral QHS   Continuous Infusions:   Active Problems:   DM2 (diabetes mellitus, type 2) (HCC)   CAD (coronary artery disease)   TIA (transient ischemic attack)   CVA (cerebral infarction)   Acute CVA (cerebrovascular accident) (HCC)    Time spent: 20 minutes    Vidya Bamford, MD, FACP, FHM. [facility] Pager [phone]  If 7PM-7AM, please contact night-coverage [email or web address] Password [email or web address] 11/16/2015, 4:42 PM    LOS: 2 days

## 2015-11-16 NOTE — Progress Notes (Signed)
  Echocardiogram 2D Echocardiogram has been performed.  Cathie BeamsGREGORY, Lizzette Carbonell 11/16/2015, 2:58 PM

## 2015-11-16 NOTE — Progress Notes (Signed)
STROKE TEAM PROGRESS NOTE   HISTORY Judy Perez is an 81 y.o. female hx of HTN, CAD, HLD presenting to ED after suffering a fall and hitting her head. At baseline requires assistance walking, lives in assisted living. Yesterday fell while getting up from the toilet, hit head against the wall. Currently denies any acute symptoms, denies focal weakness, sensory or speech deficits.  CT head imaging reviewed, shows possible acute infarct. MRI brain imaging reviewed, shows acute infarct in the right occipital/temporal lobe. Modified Rankin: Rankin Score=2  Last known well is unclear. Patient was not administered TPA secondary to unknown LKW. She was admitted for further evaluation and treatment.   SUBJECTIVE (INTERVAL HISTORY) Patient seen while in vascular lab   Overall she feels her condition is unchanged. She is in assisted living but due to her cognitive impairment but is doubtful if she will be able to go back OBJECTIVE Temp:  [98.2 F (36.8 C)-98.6 F (37 C)] 98.5 F (36.9 C) (12/01 0925) Pulse Rate:  [66-98] 81 (12/01 0925) Cardiac Rhythm:  [-]  Resp:  [18-20] 20 (12/01 0925) BP: (124-209)/(46-88) 149/80 mmHg (12/01 0925) SpO2:  [93 %-96 %] 93 % (12/01 0925)  CBC:   Recent Labs Lab 11/14/15 0828  WBC 5.8  HGB 14.1  HCT 42.4  MCV 95.1  PLT 230    Basic Metabolic Panel:   Recent Labs Lab 11/14/15 0828  NA 144  K 4.3  CL 106  CO2 31  GLUCOSE 114*  BUN 23*  CREATININE 1.07*  CALCIUM 10.0    Lipid Panel:     Component Value Date/Time   CHOL 187 11/15/2015 0217   TRIG 129 11/15/2015 0217   HDL 51 11/15/2015 0217   CHOLHDL 3.7 11/15/2015 0217   VLDL 26 11/15/2015 0217   LDLCALC 110* 11/15/2015 0217   HgbA1c:  Lab Results  Component Value Date   HGBA1C 7.6* 11/15/2015   Urine Drug Screen:     Component Value Date/Time   LABOPIA NONE DETECTED 02/21/2015 0513   COCAINSCRNUR NONE DETECTED 02/21/2015 0513   LABBENZ NONE DETECTED 02/21/2015 0513   AMPHETMU NONE DETECTED 02/21/2015 0513   THCU NONE DETECTED 02/21/2015 0513   LABBARB NONE DETECTED 02/21/2015 0513      IMAGING  Dg Pelvis 1-2 Views 11/14/2015  There is no acute bony abnormality of the pelvis. There is mild degenerative change of both hips.   Dg Sacrum/coccyx 11/14/2015  No fracture or diastases. Bones osteoporotic. Mild spondylolisthesis at L4-5, likely due to underlying spondylosis.   Dg Knee 2 Views Left 11/14/2015  Mild generalized joint space narrowing. Extensive chondrocalcinosis. Chondrocalcinosis may be seen with osteoarthritis but also may be seen with calcium pyrophosphate deposition disease. No fracture or joint effusion.   Dg Ankle Complete Left 11/14/2015  1. No acute osseous abnormality. 2. Mild osteoarthritis with a subchondral cyst involving the medial talar dome. 3. Severe chronic diffuse soft tissue swelling, especially medially.   Ct Head & Cervical Spine Wo Contrast 11/14/2015  1. There is new area of decreased attenuation in right posterior parietal lobe axial image 16 measures at least 4.5 cm. Evolving or subacute infarct cannot be excluded. Clinical correlation is necessary. Further correlation with brain MRI is recommended. 2. Stable atrophy and chronic white matter disease. 3. No cervical spine acute fracture or subluxation. Multilevel degenerative changes   Mr Brain Wo Contrast 11/14/2015   1. Acute 4-5 cm infarct in the posterior right hemisphere, right MCA/PCA watershed area. Trace petechial hemorrhage. No mass  effect. 2. Chronic superimposed chronic small vessel ischemia in the right MCA and PCA territories. 3.  The examination had to be discontinued prior to completion.   Dg Shoulder Left 11/14/2015  1. No acute osseous abnormality. 2. Severe degenerative changes involving the acromioclavicular joint. 3. Chronic calcific supraspinatus tendinitis. 4. Mild osteoarthritis involving the glenohumeral joint.    PHYSICAL EXAM Pleasant elderly  Caucasian lady not in distress. . Afebrile. Head is nontraumatic. Neck is supple without bruit.    Cardiac exam no murmur or gallop. Lungs are clear to auscultation. Distal pulses are well felt. Neurological Exam :  Awake alert oriented 2. Diminished attention, registration and recall. No aphasia or apraxia or dysarthria. Diminished recall 2/3. Unable to name animals. Because of her poor baseline limited vision unable to draw clock Extraocular movements are full range without nystagmus. Dense left homonymous hemianopsia. Pupils irregular but equal reactive. Vision acuity diminished bilaterally left more than right Fundi were not visualized. No facial weakness. Tongue midline. Motor system exam no upper or lower extremity drift but mild weakness of left grip and intrinsic hand muscles. Orbits right over left upper extremity. Mild weakness of left hip flexors. Sensation appears intact bilaterally. Gait was not tested ASSESSMENT/PLAN [name] is a 81 y.o. female with history of CAD, hypertension, type 2 DM, peripheral neuropathy, TIA presenting with confusion following a fall. She did not receive IV t-PA due to unknown LKW.   Stroke:  right MCA/PCA watershed territory infarct with trace petechial hemorrhage, infarct embolic secondary to unknown source  Resultant  L field cut and  Mild cognitive impairment  ? old  MRI  R MCA/PCA watershed infarct. Chronic R MCA/PCA small vessel disease   MRA  pending   Carotid Doppler  1-39% bilateral ICA stenosis. Tortuous vessels.  2D Echo  pending   LDL 110  HgbA1c 7.6  Lovenox 40 mg sq daily for VTE prophylaxis Diet Carb Modified Fluid consistency:: Thin; Room service appropriate?: Yes  aspirin 325 mg daily and clopidogrel 75 mg daily prior to admission, now on aspirin 325 mg daily  Patient counseled to be compliant with her antithrombotic medications  Ongoing aggressive stroke risk factor management  Therapy recommendations:  Supervision,  SNF, RW Disposition:  SNFHypertensive Urgency  BP 228/92 on arrival  Permissive hypertension (OK if < 220/120) but gradually normalize in 5-7 days  Hyperlipidemia  Home meds:  zocor 10, increased to 15 in hospital  LDL 110, goal < 70  Continue statin at discharge  Diabetes type II  HgbA1c pending , goal < 7.0  Other Stroke Risk Factors  Advanced age  Former Cigarette smoker, quit smoking 12 years ago  ETOH use  Obesity, Body mass index is 32.72 kg/(m^2).   Hx TIA  Family hx stroke (father)  Coronary artery disease    Hospital day # 2  SETHI,PRAMOD  [facility] See Amion for Pager information 11/16/2015 1:09 PM  I have personally examined this patient, reviewed notes, independently viewed imaging studies, participated in medical decision making and plan of care. I have made any additions or clarifications directly to the above note. Agree with note above. She presented with a fall and has the left  hemianopsia related to a right posterior MCA/PCA infarct etiology to be determined. She also appears to have mild cognitive impairment which may impact her ability to go and live in assisted living and may need to consider skilled nursing facility. Discussed with Dr. Waymon Amato.  Delia Heady, MD Medical Director  [facility] Pager: [phone] 11/16/2015 1:09 PM    To contact Stroke Continuity provider, please refer to [email or web address]. After hours, contact General Neurology

## 2015-11-16 NOTE — Clinical Social Work Note (Signed)
Clinical Social Work Assessment  Patient Details  Name: Judy ParasLouise Hodder MRN: [id number] Date of Birth: 09/08/1930  Date of referral:  11/16/15               Reason for consult:  Facility Placement                Permission sought to share information with:  Family Supports Permission granted to share information::  Yes, Verbal Permission Granted  Name::      Ermalene SearingJack Vicente patient's son  Agency::  SNF admissions  Relationship::     Contact Information:     Housing/Transportation Living arrangements for the past 2 months:  Assisted Living Facility Source of Information:  Adult Children Patient Interpreter Needed:  None Criminal Activity/Legal Involvement Pertinent to Current Situation/Hospitalization:  No - Comment as needed Significant Relationships:  Adult Children Lives with:  Facility Resident Do you feel safe going back to the place where you live?  Yes Need for family participation in patient care:  Yes (Comment) (Patient has dementia)  Care giving concerns: Patient is a resident at [facility] on [address], plan is for patient to return back to facility once she has received some PT and OT.  Social Worker assessment / plan:  Patient is a pleasant 81 year old female who is a resident of [facility] on [address].  Patient has some dementia, assessment was completed speaking to patient's son.  [facility] has [facility] for a few years, and patient's son expressed they have been pleased with it.  Patient's son stated he was hoping she could return to ALF, but understands she needs some rehab first before she returns back to [facility].  Patient has not been in rehab before, CSW explained process to patient's son and also informed son that [facility] feels she needs some therapy before she can return to ALF.  CSW spoke to [facility] who said they have worked closely with [facility] since it is close, and they will check on patient to make sure she is progressing well enough to  return back to ALF.  Patient's son expressed that he understood and did not have any other questions.  Employment status:  Retired Database administratornsurance information:  Managed Medicare PT Recommendations:  Skilled Nursing Facility Information / Referral to community resources:     Patient/Family's Response to care: Patient's son agreeable to patient going to SNF for short term rehab.  Patient/Family's Understanding of and Emotional Response to Diagnosis, Current Treatment, and Prognosis:  Patient not aware of diagnosis and treatment plan, but son is aware of current treatment plan.  Emotional Assessment Appearance:  Appears stated age Attitude/Demeanor/Rapport:    Affect (typically observed):  Appropriate, Calm, Pleasant Orientation:  Oriented to Self Alcohol / Substance use:  Not Applicable Psych involvement (Current and /or in the community):  No (Comment)  Discharge Needs  Concerns to be addressed:  No discharge needs identified Readmission within the last 30 days:  No Current discharge risk:  None Barriers to Discharge:  Insurance Authorization   [place] [employer], [place] Wanner R, LCSWA 11/16/2015, 4:55 PM

## 2015-11-17 DIAGNOSIS — E785 Hyperlipidemia, unspecified: Secondary | ICD-10-CM

## 2015-11-17 DIAGNOSIS — I160 Hypertensive urgency: Secondary | ICD-10-CM

## 2015-11-17 LAB — GLUCOSE, CAPILLARY
GLUCOSE-CAPILLARY: 115 mg/dL — AB (ref 65–99)
Glucose-Capillary: 160 mg/dL — ABNORMAL HIGH (ref 65–99)
Glucose-Capillary: 109 mg/dL — ABNORMAL HIGH (ref 65–99)

## 2015-11-17 MED ORDER — LISINOPRIL 20 MG PO TABS
40.0000 mg | ORAL_TABLET | Freq: Every day | ORAL | Status: DC
  Filled 2015-11-17: qty 2

## 2015-11-17 MED ORDER — ASPIRIN EC 81 MG PO TBEC: 81.0000 mg | DELAYED_RELEASE_TABLET | Freq: Every day | ORAL | Status: DC

## 2015-11-17 MED ORDER — ASPIRIN 81 MG PO TBEC: 81.0000 mg | DELAYED_RELEASE_TABLET | Freq: Every day | ORAL | Status: AC

## 2015-11-17 MED ORDER — GLUCERNA SHAKE PO LIQD: 237.0000 mL | Freq: Three times a day (TID) | ORAL | Status: AC

## 2015-11-17 MED ORDER — CLONIDINE HCL 0.1 MG PO TABS
0.1000 mg | ORAL_TABLET | Freq: Two times a day (BID) | ORAL | Status: DC
  Filled 2015-11-17: qty 1

## 2015-11-17 MED ORDER — SIMVASTATIN 10 MG PO TABS: 20.0000 mg | ORAL_TABLET | Freq: Every day | ORAL | Status: AC

## 2015-11-17 MED ORDER — ZINC OXIDE 12.8 % EX OINT: 1.0000 "application " | TOPICAL_OINTMENT | CUTANEOUS | Status: AC | PRN

## 2015-11-17 MED ORDER — CLOPIDOGREL BISULFATE 75 MG PO TABS
75.0000 mg | ORAL_TABLET | Freq: Every day | ORAL | Status: DC
  Administered 2015-11-17: 75 mg via ORAL
  Filled 2015-11-17: qty 1

## 2015-11-17 MED ORDER — CLOPIDOGREL BISULFATE 75 MG PO TABS: 75.0000 mg | ORAL_TABLET | Freq: Every day | ORAL | Status: AC

## 2015-11-17 NOTE — Discharge Summary (Addendum)
Physician Discharge Summary  Kallista Pae ZOX:[id number] DOB: 27-Sep-1930 DOA: 11/14/2015  PCP: Florentina Jenny, MD  Admit date: 11/14/2015 Discharge date: 11/17/2015  Time spent: Greater than 30 minutes  Recommendations for Outpatient Follow-up:  1. M.D. at SNF in 1 week with repeat labs (CBC & BMP) 2. Dr. Delia Heady, Neurology in 2 months. SNF to arrange follow-up appointment.  Discharge Diagnoses:  Active Problems:   DM2 (diabetes mellitus, type 2) (HCC)   CAD (coronary artery disease)   TIA (transient ischemic attack)   CVA (cerebral infarction)   Acute CVA (cerebrovascular accident) [facility])   Essential hypertension   Discharge Condition: Improved & Stable  Diet recommendation: Heart healthy and diabetic diet.  Filed Weights   11/14/15 1900  Weight: 78.5 kg (173 lb 1 oz)    History of present illness:  81 year old female with history of HTN, DM 2 with peripheral neuropathy, TIA, CAD, HLD, presented to the ED after suffering a fall and hitting her head. At baseline requires assistance walking and lives at an assisted living facility. CT head showed possible acute infarct. Admitted for further evaluation and management. Neurology consulted and patient being evaluated for acute stroke.  Hospital Course:   Stroke: Right MCA/PCA watershed territory infarct with trace petechial hemorrhage - Neurology consultation and follow-up appreciated - Etiology: Embolic secondary to unknown source - Resulting left visual field deficit/left hemianopsia - MRI brain: Confirms right MCA/PCA watershed infarct. Chronic right MCA/PCA small vessel disease. - MRA brain: Results as below. Shows short segment right PCA high-grade stenosis/near occlusion-in light of the MRI findings this is likely an acute arterial lesion. - Carotid Dopplers: 1-39 percent ICA plaquing. Tortuous vessels. Vertebral artery flow is antegrade. Preliminary report. - 2-D echo: LVEF 65-70 percent with grade 1 diastolic  dysfunction. No cardiac source of emboli was identified. - LDL 110 - Hemoglobin A1c: 7.6 - Patient was on aspirin 325 MG daily prior to admission. After reviewing today's MRA brain results, neurology has changed her antiplatelet therapy to aspirin 81 MG daily and Plavix 75 MG daily. - PT recommends SNF. Family however wishes for patient to return to prior ALF if they are willing to accept. - Patient has some cognitive impairment possibly from underlying dementia complicated by stroke and hospitalization. No agitation. - Discussed with stroke service who have cleared her for discharge.  Hypertensive urgency - Blood pressure on arrival was 228/92. Permissive hypertension (okay if <220/120) but gradually normalize in 5-7 days - Blood pressures mostly stable in the 120-140/80-90 range. Single blood pressure of 209/46? Error. - Home medications: Clonidine, Imdur, Bystolic and lisinopril were temporarily on hold to allow for permissive hypertension. These have been gradually resumed. Monitor closely at SNF.  Hyperlipidemia - LDL 110, goal <70 - Increased Zocor from 10 mg to 20 mg daily.  Type II DM with peripheral neuropathy - A1c: 7.6. Goal <7. Reasonable inpatient control. Resume metformin at discharge and may consider adjusting dose at SNF for better control.  Dementia - Mental status probably at baseline. Continue prior to admission medications.     Consultants:  Neurology  Procedures:  2-D echo: Study Conclusions  - Left ventricle: The cavity size was normal. There was moderate concentric hypertrophy. Systolic function was vigorous. The estimated ejection fraction was in the range of 65% to 70%. There was dynamic obstruction in the mid cavity, with a peak velocity of 200 cm/sec and a peak gradient of 16 mm Hg. Wall motion was normal; there were no regional wall motion abnormalities. Doppler parameters  are consistent with abnormal left ventricular relaxation  (grade 1 diastolic dysfunction).  Impressions:  - No cardiac source of embolism was identified, but cannot be ruled out on the basis of this examination.  Antibiotics:  None   Discharge Exam:  Complaints: Denies complaints and states that she is happy with the treatment that she has received thus far in the hospital.  Filed Vitals:   11/16/15 2153 11/17/15 0143 11/17/15 0600 11/17/15 0930  BP: 125/62 159/104 186/80 164/75  Pulse: 80 86 87 82  Temp: 98.3 F (36.8 C)  98.6 F (37 C) 99 F (37.2 C)  TempSrc: Oral  Axillary Oral  Resp: Height:      Weight:      SpO2: 98%  95% 94%    General exam: Pleasant elderly female sitting up comfortably on chair this morning.Marland Kitchen  Respiratory system: Clear. No increased work of breathing. Cardiovascular system: S1 & S2 heard, RRR. No JVD, murmurs, gallops, clicks or pedal edema. Gastrointestinal system: Abdomen is nondistended, soft and nontender. Normal bowel sounds heard. Central nervous system: Alert and oriented to person and place only. No focal neurological deficits.  Extremities: Symmetric 5 x 5 power.  Discharge Instructions      Discharge Instructions    Call MD for:  difficulty breathing, headache or visual disturbances    Complete by:  As directed      Call MD for:  extreme fatigue    Complete by:  As directed      Call MD for:  persistant dizziness or light-headedness    Complete by:  As directed      Call MD for:  persistant nausea and vomiting    Complete by:  As directed      Call MD for:  severe uncontrolled pain    Complete by:  As directed      Call MD for:  temperature >100.4    Complete by:  As directed      Call MD for:    Complete by:  As directed   Strokelike symptoms.     Diet - low sodium heart healthy    Complete by:  As directed      Diet Carb Modified    Complete by:  As directed      Increase activity slowly    Complete by:  As directed             Medication List    STOP  taking these medications        aspirin 325 MG buffered tablet      TAKE these medications        acetaminophen 500 MG tablet  Commonly known as:  TYLENOL  Take 1,000 mg by mouth 2 (two) times daily.     aspirin 81 MG EC tablet  Take 1 tablet (81 mg total) by mouth daily.  Start taking on:  11/18/2015     bimatoprost 0.01 % Soln  Commonly known as:  LUMIGAN  Place 1 drop into both eyes daily.     capsicum oleoresin 0.025 % cream  Commonly known as:  TRIXAICIN  Apply 1 application topically every 12 (twelve) hours as needed (for pain). Apply to feet     cloNIDine 0.1 MG tablet  Commonly known as:  CATAPRES  Take 0.1 mg by mouth 2 (two) times daily.     clopidogrel 75 MG tablet  Commonly known as:  PLAVIX  Take 1 tablet (75 mg total)  by mouth daily with breakfast.     docusate sodium 100 MG capsule  Commonly known as:  COLACE  Take 200 mg by mouth at bedtime.     donepezil 5 MG tablet  Commonly known as:  ARICEPT  Take 5 mg by mouth at bedtime.     feeding supplement (GLUCERNA SHAKE) Liqd  Take 237 mLs by mouth 3 (three) times daily between meals.     I-VITE PO  Take 1 tablet by mouth daily.     ibandronate 150 MG tablet  Commonly known as:  BONIVA  Take 150 mg by mouth every 30 (thirty) days. Take in the morning with a full glass of water, on an empty stomach, and do not take anything else by mouth or lie down for the next 30 min.     isosorbide mononitrate 30 MG 24 hr tablet  Commonly known as:  IMDUR  Take 1 tablet (30 mg total) by mouth daily.     lisinopril 40 MG tablet  Commonly known as:  PRINIVIL,ZESTRIL  Take 40 mg by mouth daily.     metFORMIN 1000 MG tablet  Commonly known as:  GLUCOPHAGE  Take 1,000 mg by mouth daily with breakfast.     nebivolol 5 MG tablet  Commonly known as:  BYSTOLIC  Take 5 mg by mouth daily.     OYSTER SHELL CALCIUM + D 500-400 MG-UNIT Tabs  Generic drug:  Calcium Carb-Cholecalciferol  Take 1 tablet by mouth 3 (three)  times daily.     polyvinyl alcohol 1.4 % ophthalmic solution  Commonly known as:  LIQUIFILM TEARS  Place 2 drops into both eyes 4 (four) times daily.     pregabalin 150 MG capsule  Commonly known as:  LYRICA  Take 150 mg by mouth 2 (two) times daily.     psyllium 0.52 G capsule  Commonly known as:  REGULOID  Take 0.52 g by mouth at bedtime.     sertraline 50 MG tablet  Commonly known as:  ZOLOFT  Take 50 mg by mouth daily.     simvastatin 10 MG tablet  Commonly known as:  ZOCOR  Take 2 tablets (20 mg total) by mouth at bedtime.     SYSTANE ULTRA OP  Place 1 drop into both eyes 3 (three) times daily.     Zinc Oxide 12.8 % ointment  Commonly known as:  TRIPLE PASTE  Apply 1 application topically as needed for irritation. Apply to buttocks          The results of significant diagnostics from this hospitalization (including imaging, microbiology, ancillary and laboratory) are listed below for reference.    Significant Diagnostic Studies: Dg Pelvis 1-2 Views  11/14/2015  CLINICAL DATA:  Status post fall while standing up in the bathroom. Patient reports back and tail bone pain. EXAM: PELVIS - 1-2 VIEW COMPARISON:  AP pelvis of September 12, 2015 FINDINGS: The bones are osteopenic. The sacrum and SI joints are grossly intact. The pubic rami are intact. There is mild narrowing of the hip joint spaces bilaterally. The pubic rami are intact. The proximal femurs are intact where visualized. There are numerous pelvic phleboliths and there are arterial calcifications. IMPRESSION: There is no acute bony abnormality of the pelvis. There is mild degenerative change of both hips. Electronically Signed   By: David  [place] M.D.   On: 11/14/2015 10:28   Dg Sacrum/coccyx  11/14/2015  CLINICAL DATA:  Pain following fall EXAM: SACRUM AND COCCYX - 2+ VIEW  COMPARISON:  None. FINDINGS: Frontal and lateral views were obtained. No fracture or diastases. There is osteoarthritic change in both  sacroiliac joints. Bones are somewhat osteoporotic. There is mild spondylolisthesis at L4-5, likely due to underlying spondylosis. There are scattered foci of arterial vascular calcification. IMPRESSION: No fracture or diastases. Bones osteoporotic. Mild spondylolisthesis at L4-5, likely due to underlying spondylosis. Electronically Signed   By: Bretta Bang III M.D.   On: 11/14/2015 10:29   Dg Knee 2 Views Left  11/14/2015  CLINICAL DATA:  Pain following fall EXAM: LEFT KNEE - 4 VIEW COMPARISON:  None. FINDINGS: Frontal, lateral, and bilateral oblique views were obtained. There is no demonstrable fracture or dislocation. No joint effusion. There is mild generalized joint space narrowing. There is extensive chondrocalcinosis. IMPRESSION: Mild generalized joint space narrowing. Extensive chondrocalcinosis. Chondrocalcinosis may be seen with osteoarthritis but also may be seen with calcium pyrophosphate deposition disease. No fracture or joint effusion. Electronically Signed   By: Bretta Bang III M.D.   On: 11/14/2015 10:26   Dg Ankle Complete Left  11/14/2015  CLINICAL DATA:  81 year old who fell while standing up in the bathroom earlier today. Left ankle pain. Initial encounter. EXAM: LEFT ANKLE COMPLETE - 3+ VIEW COMPARISON:  Left foot x-rays 03/14/2015. No prior left ankle x-rays. FINDINGS: Severe chronic diffuse soft tissue swelling, especially medially, as noted previously. No evidence of acute fracture or dislocation. Ankle mortise intact with mild joint space narrowing. Subchondral cyst involving the medial aspect of the tailor dome. Osseous demineralization. No visible joint effusion. IMPRESSION: 1. No acute osseous abnormality. 2. Mild osteoarthritis with a subchondral cyst involving the medial talar dome. 3. Severe chronic diffuse soft tissue swelling, especially medially. Electronically Signed   By: Hulan Saas M.D.   On: 11/14/2015 10:29   Ct Head Wo Contrast  11/14/2015   CLINICAL DATA:  Fall yesterday, hit head, on Plavix EXAM: CT HEAD WITHOUT CONTRAST CT CERVICAL SPINE WITHOUT CONTRAST TECHNIQUE: Multidetector CT imaging of the head and cervical spine was performed following the standard protocol without intravenous contrast. Multiplanar CT image reconstructions of the cervical spine were also generated. COMPARISON:  09/12/2015 FINDINGS: CT HEAD FINDINGS No skull fracture is noted. Paranasal sinuses and mastoid air cells are unremarkable. No intracranial hemorrhage, mass effect or midline shift. There is stable atrophy. Stable chronic white matter disease. There is new area of decreased attenuation in right posterior parietal lobe measures at least 4.5 cm. This is highly suspicious for acute or subacute infarct. Clinical correlation is necessary. Further correlation with brain MRI with diffusion imaging is recommended. Stable lacunar infarct right anterior periventricular. CT CERVICAL SPINE FINDINGS No skull fracture is noted. Paranasal sinuses and mastoid air cells are unremarkable. Degenerative changes are noted C1-C2 articulation. There is pannus formation surrounding the odontoid. Computer processed images shows no acute fracture or subluxation. Disc space flattening with mild anterior spurring at C3-C4 level. Significant disc space flattening with anterior and posterior spurring at C4-C5-C5-C6 and C6-C7 level. No prevertebral soft tissue swelling. Cervical airway is patent. There is no pneumothorax in visualized lung apices. IMPRESSION: 1. There is new area of decreased attenuation in right posterior parietal lobe axial image 16 measures at least 4.5 cm. Evolving or subacute infarct cannot be excluded. Clinical correlation is necessary. Further correlation with brain MRI is recommended. 2. Stable atrophy and chronic white matter disease. 3. No cervical spine acute fracture or subluxation. Multilevel degenerative changes as described above. These results were called by  telephone at the time  of interpretation on 11/14/2015 at 11:48 am to Dr. Crista Curb , who verbally acknowledged these results. Electronically Signed   By: Natasha Mead M.D.   On: 11/14/2015 11:48   Ct Cervical Spine Wo Contrast  11/14/2015  CLINICAL DATA:  Fall yesterday, hit head, on Plavix EXAM: CT HEAD WITHOUT CONTRAST CT CERVICAL SPINE WITHOUT CONTRAST TECHNIQUE: Multidetector CT imaging of the head and cervical spine was performed following the standard protocol without intravenous contrast. Multiplanar CT image reconstructions of the cervical spine were also generated. COMPARISON:  09/12/2015 FINDINGS: CT HEAD FINDINGS No skull fracture is noted. Paranasal sinuses and mastoid air cells are unremarkable. No intracranial hemorrhage, mass effect or midline shift. There is stable atrophy. Stable chronic white matter disease. There is new area of decreased attenuation in right posterior parietal lobe measures at least 4.5 cm. This is highly suspicious for acute or subacute infarct. Clinical correlation is necessary. Further correlation with brain MRI with diffusion imaging is recommended. Stable lacunar infarct right anterior periventricular. CT CERVICAL SPINE FINDINGS No skull fracture is noted. Paranasal sinuses and mastoid air cells are unremarkable. Degenerative changes are noted C1-C2 articulation. There is pannus formation surrounding the odontoid. Computer processed images shows no acute fracture or subluxation. Disc space flattening with mild anterior spurring at C3-C4 level. Significant disc space flattening with anterior and posterior spurring at C4-C5-C5-C6 and C6-C7 level. No prevertebral soft tissue swelling. Cervical airway is patent. There is no pneumothorax in visualized lung apices. IMPRESSION: 1. There is new area of decreased attenuation in right posterior parietal lobe axial image 16 measures at least 4.5 cm. Evolving or subacute infarct cannot be excluded. Clinical correlation is necessary.  Further correlation with brain MRI is recommended. 2. Stable atrophy and chronic white matter disease. 3. No cervical spine acute fracture or subluxation. Multilevel degenerative changes as described above. These results were called by telephone at the time of interpretation on 11/14/2015 at 11:48 am to Dr. Crista Curb , who verbally acknowledged these results. Electronically Signed   By: Natasha Mead M.D.   On: 11/14/2015 11:48   Mr Brain Wo Contrast  11/14/2015  CLINICAL DATA:  81 year old female who fell off toilet at 1700 hours yesterday. Confusion. Pain. Initial encounter. The examination had to be discontinued prior to completion due to patient condition. EXAM: MRI HEAD WITHOUT CONTRAST TECHNIQUE: Multiplanar, multiecho pulse sequences of the brain and surrounding structures were obtained without intravenous contrast. COMPARISON:  head and cervical spine CT 1120 hours today and earlier. FINDINGS: Confluent area of restricted diffusion in compass Ing 4-5 cm of the lateral right occipital lobe and posterior most right temporal lobe. This corresponds to the cortical hypodensity on the earlier CT. No other right hemisphere restricted diffusion. There is associated petechial hemorrhage (series 8, image 13). Cytotoxic edema as seen on the earlier CT, no significant mass effect. No contralateral left hemisphere, or posterior fossa restricted diffusion. Major intracranial vascular flow voids are within normal limits. Chronic lacunar type infarcts in the right MCA white matter, right basal ganglia, right thalamus. Additional Patchy and confluent bilateral cerebral white matter nonspecific T2 and FLAIR hyperintensity. Brainstem and cerebellum are normal for age. Chronic hemosiderin associated with the right basal ganglia lacune. No midline shift, mass effect, evidence of mass lesion, ventriculomegaly, or extra-axial collection. Negative visualized cervical spine. Cervicomedullary junction and pituitary are within normal  limits. Normal bone marrow signal. Postoperative changes to the globes. Visualized paranasal sinuses and mastoids are clear. IMPRESSION: 1. Acute 4-5 cm infarct in the  posterior right hemisphere, right MCA/PCA watershed area. Trace petechial hemorrhage. No mass effect. 2. Chronic superimposed chronic small vessel ischemia in the right MCA and PCA territories. 3.  The examination had to be discontinued prior to completion. Electronically Signed   By: Odessa FlemingH  Hall M.D.   On: 11/14/2015 14:49   Dg Shoulder Left  11/14/2015  CLINICAL DATA:  81 year old who fell while standing up in the bathroom earlier today. Left shoulder pain. Initial encounter. EXAM: LEFT SHOULDER - 2+ VIEW COMPARISON:  None. FINDINGS: No evidence of acute fracture or glenohumeral dislocation. Subacromial space well preserved. Acromioclavicular joint intact with severe degenerative changes. Osseous demineralization. Faint calcification at the insertion of the supraspinatus tendon on the greater tuberosity of the humeral head. Small spur arising from the inferior glenoid. IMPRESSION: 1. No acute osseous abnormality. 2. Severe degenerative changes involving the acromioclavicular joint. 3. Chronic calcific supraspinatus tendinitis. 4. Mild osteoarthritis involving the glenohumeral joint. Electronically Signed   By: Hulan Saashomas  Lawrence M.D.   On: 11/14/2015 10:31   Mr Maxine GlennMra Head/brain Wo Cm  11/16/2015  CLINICAL DATA:  81 year old female with posterior right hemisphere infarct. Unknown time of ictus. Presented after fall from toilet. Initial encounter. EXAM: MRA HEAD WITHOUT CONTRAST TECHNIQUE: Angiographic images of the Circle of Willis were obtained using MRA technique without intravenous contrast. COMPARISON:  Brain MRI 11/14/2015. FINDINGS: Diminutive vertebrobasilar system with a persistent trigeminal artery on the left. The distal left vertebral artery functionally terminates in PICA. No distal right vertebral artery stenosis. Patent right PICA  origin. No basilar artery stenosis. AICA origins are patent. SCA origins are patent. The right PCA origin and P1 segment are normal. There is a short segment high-grade stenosis or near occlusion of the right P2 segment (series 305, image 14). There is preserved but attenuated distal right PCA flow. The contralateral left PCA might have a fetal type origin, uncertain. The left posterior communicating artery and left P1 segment are poorly patency of the left P2 segment. Visualized. However, there is there is stenosis at the distal left P2 segment which appears to be exacerbated by motion artifact. There is attenuated distal left PCA flow. Antegrade flow in both ICA siphons. Persistent left trigeminal artery. Siphon irregularity indicating atherosclerosis without stenosis. Patent carotid termini. Left posterior communicating artery origin appears to be patent. MCA and ACA origins are normal. Anterior communicating artery is normal. Detail of the bilateral MCA and ACA vessels is degraded by motion. Both M1 segments and MCA bifurcations appear patent. There is asymmetrically decreased posterior right MCA division flow signal compared to that on the left. IMPRESSION: 1. Short segment right PCA high-grade stenosis/near occlusion. Preserved but attenuated distal right PCA flow. In light of the MRI findings this is likely an acute arterial lesion. 2. Detail of the bilateral MCA and ACA branches degraded by motion. Asymmetrically decreased flow in the posterior right MCA division, but no anterior circulation emergent large vessel occlusion. 3. Superimposed high-grade stenosis either fetal type or conventional origin of the left PCA. Preserved but attenuated distal left PCA flow. 4. Persistent left trigeminal artery normal anatomic variation. Electronically Signed   By: Odessa FlemingH  Hall M.D.   On: 11/16/2015 19:17    Microbiology: Recent Results (from the past 240 hour(s))  MRSA PCR Screening     Status: None   Collection Time:  11/14/15  9:27 PM  Result Value Ref Range Status   MRSA by PCR NEGATIVE NEGATIVE Final    Comment:        The  GeneXpert MRSA Assay (FDA approved for NASAL specimens only), is one component of a comprehensive MRSA colonization surveillance program. It is not intended to diagnose MRSA infection nor to guide or monitor treatment for MRSA infections.      Labs: Basic Metabolic Panel:  Recent Labs Lab 11/14/15 0828  NA 144  K 4.3  CL 106  CO2 31  GLUCOSE 114*  BUN 23*  CREATININE 1.07*  CALCIUM 10.0   Liver Function Tests: No results for input(s): AST, ALT, ALKPHOS, BILITOT, PROT, ALBUMIN in the last 168 hours. No results for input(s): LIPASE, AMYLASE in the last 168 hours. No results for input(s): AMMONIA in the last 168 hours. CBC:  Recent Labs Lab 11/14/15 0828  WBC 5.8  HGB 14.1  HCT 42.4  MCV 95.1  PLT 230   Cardiac Enzymes: No results for input(s): CKTOTAL, CKMB, CKMBINDEX, TROPONINI in the last 168 hours. BNP: BNP (last 3 results) No results for input(s): BNP in the last 8760 hours.  ProBNP (last 3 results) No results for input(s): PROBNP in the last 8760 hours.  CBG:  Recent Labs Lab 11/15/15 2144 11/16/15 0635 11/16/15 1212 11/16/15 1625 11/16/15 2242  GLUCAP 103* 127* 129* 144* 133*   Left message for patient's son Mr. Trenity Pha.   Signed:  Marcellus Scott, MD, FACP, FHM. [facility] Pager [phone]  If 7PM-7AM, please contact night-coverage [email or web address] Password [email or web address] 11/17/2015, 1:50 PM

## 2015-11-17 NOTE — Progress Notes (Signed)
Patient is being d/c to a skilled nursing home. Report called to the receiving nurse Windell Mouldinguth. Patient awaiting transportation.

## 2015-11-17 NOTE — Clinical Social Work Note (Signed)
[facility]  CSW remains available as needed.   Derenda FennelBashira Secily Walthour, MSW, LCSWA [phone] 11/17/2015 12:02 PM

## 2015-11-17 NOTE — Care Management Important Message (Signed)
Important Message  Patient Details  Name: Judy Perez MRN: [id number] Date of Birth: [birth year]   Medicare Important Message Given:  Yes    Rachella Basden P Hesston Hitchens 11/17/2015, 2:12 PM

## 2015-11-17 NOTE — Progress Notes (Addendum)
STROKE TEAM PROGRESS NOTE   HISTORY Judy Perez is an 81 y.o. female hx of HTN, CAD, HLD presenting to ED after suffering a fall and hitting her head. At baseline requires assistance walking, lives in assisted living. Yesterday fell while getting up from the toilet, hit head against the wall. Currently denies any acute symptoms, denies focal weakness, sensory or speech deficits.  CT head imaging reviewed, shows possible acute infarct. MRI brain imaging reviewed, shows acute infarct in the right occipital/temporal lobe. Modified Rankin: Rankin Score=2  Last known well is unclear. Patient was not administered TPA secondary to unknown LKW. She was admitted for further evaluation and treatment.   SUBJECTIVE (INTERVAL HISTORY)     Overall she feels her condition is unchanged. She has left homonymous anopsia. She has poor insight into her condition. She is pleasant and cooperative.  OBJECTIVE Temp:  [98.1 F (36.7 C)-99 F (37.2 C)] 99 F (37.2 C) (12/02 0930) Pulse Rate:  [71-87] 82 (12/02 0930) Cardiac Rhythm:  [-]  Resp:  [20] 20 (12/02 0930) BP: (125-186)/(58-104) 164/75 mmHg (12/02 0930) SpO2:  [93 %-98 %] 94 % (12/02 0930)  CBC:   Recent Labs Lab 11/14/15 0828  WBC 5.8  HGB 14.1  HCT 42.4  MCV 95.1  PLT 230    Basic Metabolic Panel:   Recent Labs Lab 11/14/15 0828  NA 144  K 4.3  CL 106  CO2 31  GLUCOSE 114*  BUN 23*  CREATININE 1.07*  CALCIUM 10.0    Lipid Panel:     Component Value Date/Time   CHOL 187 11/15/2015 0217   TRIG 129 11/15/2015 0217   HDL 51 11/15/2015 0217   CHOLHDL 3.7 11/15/2015 0217   VLDL 26 11/15/2015 0217   LDLCALC 110* 11/15/2015 0217   HgbA1c:  Lab Results  Component Value Date   HGBA1C 7.6* 11/15/2015   Urine Drug Screen:     Component Value Date/Time   LABOPIA NONE DETECTED 02/21/2015 0513   COCAINSCRNUR NONE DETECTED 02/21/2015 0513   LABBENZ NONE DETECTED 02/21/2015 0513   AMPHETMU NONE DETECTED 02/21/2015 0513    THCU NONE DETECTED 02/21/2015 0513   LABBARB NONE DETECTED 02/21/2015 0513      IMAGING  Dg Pelvis 1-2 Views 11/14/2015  There is no acute bony abnormality of the pelvis. There is mild degenerative change of both hips.   Dg Sacrum/coccyx 11/14/2015  No fracture or diastases. Bones osteoporotic. Mild spondylolisthesis at L4-5, likely due to underlying spondylosis.   Dg Knee 2 Views Left 11/14/2015  Mild generalized joint space narrowing. Extensive chondrocalcinosis. Chondrocalcinosis may be seen with osteoarthritis but also may be seen with calcium pyrophosphate deposition disease. No fracture or joint effusion.   Dg Ankle Complete Left 11/14/2015  1. No acute osseous abnormality. 2. Mild osteoarthritis with a subchondral cyst involving the medial talar dome. 3. Severe chronic diffuse soft tissue swelling, especially medially.   Ct Head & Cervical Spine Wo Contrast 11/14/2015  1. There is new area of decreased attenuation in right posterior parietal lobe axial image 16 measures at least 4.5 cm. Evolving or subacute infarct cannot be excluded. Clinical correlation is necessary. Further correlation with brain MRI is recommended. 2. Stable atrophy and chronic white matter disease. 3. No cervical spine acute fracture or subluxation. Multilevel degenerative changes   Mr Brain Wo Contrast 11/14/2015   1. Acute 4-5 cm infarct in the posterior right hemisphere, right MCA/PCA watershed area. Trace petechial hemorrhage. No mass effect. 2. Chronic superimposed chronic small vessel  ischemia in the right MCA and PCA territories. 3.  The examination had to be discontinued prior to completion.   Dg Shoulder Left 11/14/2015  1. No acute osseous abnormality. 2. Severe degenerative changes involving the acromioclavicular joint. 3. Chronic calcific supraspinatus tendinitis. 4. Mild osteoarthritis involving the glenohumeral joint.    PHYSICAL EXAM Pleasant elderly Caucasian lady not in distress. . Afebrile.  Head is nontraumatic. Neck is supple without bruit.    Cardiac exam no murmur or gallop. Lungs are clear to auscultation. Distal pulses are well felt. Neurological Exam :  Awake alert oriented 2. Diminished attention, registration and recall. No aphasia or apraxia or dysarthria. Diminished recall 2/3. Unable to name animals. Because of her poor baseline limited vision unable to draw clock Extraocular movements are full range without nystagmus. Dense left homonymous hemianopsia. Pupils irregular but equal reactive. Vision acuity diminished bilaterally left more than right Fundi were not visualized. No facial weakness. Tongue midline. Motor system exam no upper or lower extremity drift but mild weakness of left grip and intrinsic hand muscles. Orbits right over left upper extremity. Mild weakness of left hip flexors. Sensation appears intact bilaterally. Gait was not tested ASSESSMENT/PLAN [name] is a 81 y.o. female with history of CAD, hypertension, type 2 DM, peripheral neuropathy, TIA presenting with confusion following a fall. She did not receive IV t-PA due to unknown LKW.   Stroke:  right MCA/PCA watershed territory infarct with trace petechial hemorrhage, infarct thromboembolic secondary to intracranial atherosclerosis  Resultant  L field cut and  Mild cognitive impairment  ? old  MRI  R MCA/PCA watershed infarct. Chronic R MCA/PCA small vessel disease  MRA  Short segment right PCA high-grade stenosis/near occlusion. Preserved but attenuated distal right PCA flow. In light of the MRI  findings this is likely an acute arterial lesion  Carotid Doppler  1-39% bilateral ICA stenosis. Tortuous vessels.  2D Echo  Left ventricle: The cavity size was normal. There was moderate concentric hypertrophy. Systolic function was vigorous. The estimated ejection fraction was in the range of 65% to 70%LDL 110  HgbA1c 7.6  Lovenox 40 mg sq daily for VTE prophylaxis Diet Carb Modified  Fluid consistency:: Thin; Room service appropriate?: Yes Diet - low sodium heart healthy Diet Carb Modified  aspirin 325 mg daily and clopidogrel 75 mg daily prior to admission, now on aspirin 325 mg daily  Patient counseled to be compliant with her antithrombotic medications  Ongoing aggressive stroke risk factor management  Therapy recommendations:  Supervision, SNF, RW Disposition:  SNFHypertensive Urgency  BP 228/92 on arrival  Permissive hypertension (OK if < 220/120) but gradually normalize in 5-7 days  Hyperlipidemia  Home meds:  zocor 10, increased to 15 in hospital  LDL 110, goal < 70  Continue statin at discharge  Diabetes type II  HgbA1c pending , goal < 7.0  Other Stroke Risk Factors  Advanced age  Former Cigarette smoker, quit smoking 12 years ago  ETOH use  Obesity, Body mass index is 32.72 kg/(m^2).   Hx TIA  Family hx stroke (father)  Coronary artery disease    Hospital day # 3  SETHI,PRAMOD  [facility] See Amion for Pager information 11/17/2015 1:52 PM  I have personally examined this patient, reviewed notes, independently viewed imaging studies, participated in medical decision making and plan of care. I have made any additions or clarifications directly to the above note. Agree with note above. She presented with a fall and has the left  hemianopsia related to a right posterior MCA/PCA infarct due to intracranial atherosis recommend aspirin and Plavix for 3 months followed by aspirin alone. She also appears to have mild cognitive impairment which may impact her ability to go and live in assisted living and may need to consider skilled nursing facility. Discussed with Dr. Waymon AmatoHongalgi. Stroke team will sign off. Call for questions. Delia HeadyPramod Sethi, MD Medical Director [facility] Pager: [phone] 11/17/2015 1:52 PM    To contact Stroke Continuity provider, please refer to [email or web address]. After hours, contact General  Neurology

## 2015-11-17 NOTE — Progress Notes (Signed)
Physical Therapy Treatment Patient Details Name: Judy Perez MRN: [id number] DOB: 1930/01/31 Today's Date: 11/17/2015    History of Present Illness Pt is an 81 y/o female who presents from ALF after a fall. Pt was found to have a subacute CVA in the right posterior parietal lobe. MRI revealed acute 4-5 cm infarct in the psoterior right hemisphere in the right MCA/PCA watershed area.    PT Comments    Pt progressing towards physical therapy goals. Was able to perform transfers with +2 assist and UE support on the RW. It appeared that the pt's hearing aid was not working, and communication was difficult due to visual deficits as well. Will continue to follow and progress as able per POC.   Follow Up Recommendations  SNF;Supervision/Assistance - 24 hour     Equipment Recommendations  Rolling walker with 5" wheels    Recommendations for Other Services       Precautions / Restrictions Precautions Precautions: Fall Precaution Comments: Hearing aide on the L side. Pt needs eye contact and slow talking to understand.  Restrictions Weight Bearing Restrictions: No    Mobility  Bed Mobility Overal bed mobility: Needs Assistance Bed Mobility: Supine to Sit     Supine to sit: Mod assist     General bed mobility comments: Assist with bed pad for scooting. HOB elevated.  Transfers Overall transfer level: Needs assistance Equipment used: Rolling walker (2 wheeled) Transfers: Sit to/from [employer]Stand;Stand Pivot Transfers Sit to Stand: Min assist;+2 physical assistance Stand pivot transfers: Min assist;+2 physical assistance       General transfer comment: Assist to power-up to full standing and take pivotal steps around to the recliner. Assist was provided for balance/support as well as walker management.   Ambulation/Gait                 Stairs            Wheelchair Mobility    Modified Rankin (Stroke Patients Only) Modified Rankin (Stroke Patients  Only) Pre-Morbid Rankin Score: Moderately severe disability Modified Rankin: Severe disability     Balance Overall balance assessment: Needs assistance Sitting-balance support: Feet supported;Bilateral upper extremity supported Sitting balance-Leahy Scale: Poor   Postural control: Posterior lean Standing balance support: Bilateral upper extremity supported;During functional activity Standing balance-Leahy Scale: Poor                      Cognition Arousal/Alertness: Awake/alert Behavior During Therapy: WFL for tasks assessed/performed Overall Cognitive Status: No family/caregiver present to determine baseline cognitive functioning                      Exercises      General Comments        Pertinent Vitals/Pain Pain Assessment: Faces Faces Pain Scale: Hurts whole lot Pain Location: Back and lower legs/feet Pain Descriptors / Indicators: Grimacing;Guarding;Burning Pain Intervention(s): Limited activity within patient's tolerance;Monitored during session;Repositioned    Home Living                      Prior Function            PT Goals (current goals can now be found in the care plan section) Acute Rehab PT Goals Patient Stated Goal: Pt did not state goals at this time.  PT Goal Formulation: Patient unable to participate in goal setting Time For Goal Achievement: 11/29/15 Potential to Achieve Goals: Fair Progress towards PT goals: Progressing toward goals  Frequency  Min 4X/week    PT Plan Current plan remains appropriate    Co-evaluation             End of Session Equipment Utilized During Treatment: Gait belt Activity Tolerance: Patient limited by fatigue;Patient limited by pain Patient left: in chair;with chair alarm set;with call bell/phone within reach     Time: 0949-1006 PT Time Calculation (min) (ACUTE ONLY): 17 min  Charges:  $Gait Training: 8-22 mins                    G Codes:      Conni Slipper 2015-11-23, 10:19 AM   Conni Slipper, PT, DPT Acute Rehabilitation Services Pager: [phone]

## 2015-11-17 NOTE — Progress Notes (Signed)
Ok per MD for d/c today to SN[facility]  [employer] auth in place per SNF; son sent admissions paperwork to sign- however he has not yet returned them to the facility.  [facility] cannot accept patient until papers are returned.  EMS arranged- however they are very backed up (at least 2 hours) and thus- anticipate that paperwork will be completed and returned by the time that the EMS comes to pick her up.  Notified unit RN to call report.  No further CSW needs identified. CSW signing off.  Lorri Frederickonna T. Jaci LazierCrowder, KentuckyLCSW [phone] (coverage)

## 2015-11-17 NOTE — Clinical Social Work Placement (Signed)
   CLINICAL SOCIAL WORK PLACEMENT  NOTE  Date:  11/17/2015  Patient Details  Name: Judy Perez MRN: [id number] Date of Birth: 28-Oct-1930  Clinical Social Work is seeking post-discharge placement for this patient at the Skilled  Nursing Facility level of care (*CSW will initial, date and re-position this form in  chart as items are completed):  Yes   Patient/family provided with [facility] Social Work Department's list of facilities offering this level of care within the geographic area requested by the patient (or if unable, by the patient's family).  Yes   Patient/family informed of their freedom to choose among providers that offer the needed level of care, that participate in Medicare, Medicaid or managed care program needed by the patient, have an available bed and are willing to accept the patient.  Yes   Patient/family informed of [facility] ownership interest [facility] and [facility], as well as of the fact that they are under no obligation to receive care at these facilities.  PASRR submitted to EDS on 11/17/15     PASRR number received on 11/17/15     Existing PASRR number confirmed on       FL2 transmitted to all facilities in geographic area requested by pt/family on 11/17/15     FL2 transmitted to all facilities within larger geographic area on       Patient informed that his/her managed care company has contracts with or will negotiate with certain facilities, including the following:        Yes   Patient/family informed of bed offers received.  [facility]     Physician recommends and patient chooses bed at      Patient to be transferred to   on  .  Patient to be transferred to facility by       Patient family notified on   of transfer.  Name of family member notified:        PHYSICIAN       Additional Comment:    _______________________________________________ Judy Perez, Judy Perez,  LCSWA 11/17/2015, 4:03 PM

## 2015-12-17 DEATH — deceased
